# Patient Record
Sex: Male | Born: 1985
Health system: Southern US, Community
[De-identification: ages and names within clinical notes are randomized; demographics above are authoritative.]

## PROBLEM LIST (undated history)

## (undated) DIAGNOSIS — Z973 Presence of spectacles and contact lenses: Secondary | ICD-10-CM

## (undated) DIAGNOSIS — K219 Gastro-esophageal reflux disease without esophagitis: Secondary | ICD-10-CM

## (undated) HISTORY — DX: Gastro-esophageal reflux disease without esophagitis: K21.9

## (undated) HISTORY — DX: Presence of spectacles and contact lenses: Z97.3

---

## 2007-09-22 HISTORY — PX: WISDOM TOOTH EXTRACTION: SHX21

## 2012-12-15 ENCOUNTER — Ambulatory Visit (INDEPENDENT_AMBULATORY_CARE_PROVIDER_SITE_OTHER): Payer: BC Managed Care – PPO | Admitting: General Surgery

## 2012-12-15 ENCOUNTER — Encounter (INDEPENDENT_AMBULATORY_CARE_PROVIDER_SITE_OTHER): Payer: Self-pay | Admitting: General Surgery

## 2012-12-15 ENCOUNTER — Encounter (INDEPENDENT_AMBULATORY_CARE_PROVIDER_SITE_OTHER): Payer: Self-pay

## 2012-12-15 VITALS — BP 132/70 | HR 84 | Temp 97.4°F | Resp 16 | Ht 68.0 in | Wt 195.0 lb

## 2012-12-15 DIAGNOSIS — IMO0002 Reserved for concepts with insufficient information to code with codable children: Secondary | ICD-10-CM

## 2012-12-15 DIAGNOSIS — S76219A Strain of adductor muscle, fascia and tendon of unspecified thigh, initial encounter: Secondary | ICD-10-CM | POA: Insufficient documentation

## 2012-12-15 NOTE — Patient Instructions (Signed)
Light activities for 4-6 weeks from the time of the injury.

## 2012-12-15 NOTE — Progress Notes (Signed)
Patient ID: Micheal Moreno, male   DOB: 1985-12-06, 27 y.o.   MRN: [id number]  Chief Complaint  Patient presents with  . Inguinal Hernia    bilateral    HPI Micheal Moreno is a 27 y.o. male.   HPI  He is referred by Micheal Guard, PA-C for evaluation of LLQ and groin pain and possible inguinal hernia.  He was working out at [employer] fairly hard and noted some severe pain in the left lower quadrant and groin region. He says he felt he saw little swelling. This happened 2 weeks ago. He sought attention at that time. He has decreased his activities and is now feeling better. No difficulty with urination. No difficulty with bowel movements. No family history of hernia disease.  Past Medical History  Diagnosis Date  . GERD (gastroesophageal reflux disease)     Past Surgical History  Procedure Laterality Date  . Tonsillectomy    . Wisdom tooth extraction  2009    History reviewed. No pertinent family history.  Social History History  Substance Use Topics  . Smoking status: Never Smoker   . Smokeless tobacco: Not on file  . Alcohol Use: No    No Known Allergies  Current Outpatient Prescriptions  Medication Sig Dispense Refill  . esomeprazole (NEXIUM) 40 MG capsule Take 40 mg by mouth daily before breakfast.       No current facility-administered medications for this visit.    Review of Systems Review of Systems  Constitutional: Negative.   Respiratory: Negative.   Cardiovascular: Negative.   Genitourinary: Negative.   Allergic/Immunologic: Negative for immunocompromised state.  Hematological: Negative.     Blood pressure 132/70, pulse 84, temperature 97.4 F (36.3 C), temperature source Temporal, resp. rate 16, height 5\' 8"  (1.727 m), weight 195 lb (88.451 kg).  Physical Exam Physical Exam  Constitutional: He appears well-developed and well-nourished. No distress.  HENT:  Head: Normocephalic and atraumatic.  Abdominal: Soft. He exhibits no distension and no  mass. There is no tenderness.  Genitourinary:  No inguinal floor defects bilaterally. Testicles are normal in size.    Data Reviewed Note from [facility]  Assessment    Left groin strain. Provocative maneuvers today do not demonstrate any evidence of left or right inguinal hernia. Symptoms are improving with rest.     Plan    Avoid strenuous core type activities for 4-6 weeks from date of injury. Return visit as needed.        Micheal Moreno 12/15/2012, 3:34 PM

## 2013-03-22 ENCOUNTER — Encounter: Payer: Self-pay | Admitting: Medical

## 2013-03-29 ENCOUNTER — Encounter: Payer: Self-pay | Admitting: Medical

## 2013-03-29 ENCOUNTER — Ambulatory Visit (INDEPENDENT_AMBULATORY_CARE_PROVIDER_SITE_OTHER): Payer: BC Managed Care – PPO | Admitting: Medical

## 2013-03-29 VITALS — BP 110/70 | HR 80 | Temp 98.0°F | Resp 16 | Ht 68.0 in | Wt 196.0 lb

## 2013-03-29 DIAGNOSIS — Z0000 Encounter for general adult medical examination without abnormal findings: Secondary | ICD-10-CM

## 2013-03-29 DIAGNOSIS — H68013 Acute Eustachian salpingitis, bilateral: Secondary | ICD-10-CM

## 2013-03-29 DIAGNOSIS — H68019 Acute Eustachian salpingitis, unspecified ear: Secondary | ICD-10-CM

## 2013-03-29 LAB — POCT URINALYSIS DIPSTICK
Bilirubin, UA: NEGATIVE
Blood, UA: NEGATIVE
Ketones, UA: NEGATIVE
Nitrite, UA: NEGATIVE
Spec Grav, UA: <=1.005
pH, UA: 5

## 2013-03-29 NOTE — Patient Instructions (Signed)
Begin OTC Sudafed x 5-7 days, increase water intake, and if this doesn't seem to resolve the problem, let me know.   Barotitis Media Barotitis media is soreness (inflammation) of the area behind the eardrum (middle ear). This occurs when the auditory tube (Eustachian tube) leading from the back of the throat to the eardrum is blocked. When it is blocked air cannot move in and out of the middle ear to equalize pressure changes. These pressure changes come from changes in altitude when:  Flying.  Driving in the mountains.  Diving. Problems are more likely to occur with pressure changes during times when you are congested as from:  Hay fever.  Upper respiratory infection.  A cold. Damage or hearing loss (barotrauma) caused by this may be permanent. HOME CARE INSTRUCTIONS   Use medicines as recommended by your caregiver. Over the counter medicines will help unblock the canal and can help during times of air travel.  Do not put anything into your ears to clean or unplug them. Eardrops will not be helpful.  Do not swim, dive, or fly until your caregiver says it is all right to do so. If these activities are necessary, chewing gum with frequent swallowing may help. It is also helpful to hold your nose and gently blow to pop your ears for equalizing pressure changes. This forces air into the Eustachian tube.  For little ones with problems, give your baby a bottle of water or juice during periods when pressure changes would be anticipated such as during take offs and landings associated with air travel.  Only take over-the-counter or prescription medicines for pain, discomfort, or fever as directed by your caregiver.  A decongestant may be helpful in de-congesting the middle ear and make pressure equalization easier. This can be even more effective if the drops (spray) are delivered with the head lying over the edge of a bed with the head tilted toward the ear on the affected side.  If your  caregiver has given you a follow-up appointment, it is very important to keep that appointment. Not keeping the appointment could result in a chronic or permanent injury, pain, hearing loss and disability. If there is any problem keeping the appointment, you must call back to this facility for assistance. SEEK IMMEDIATE MEDICAL CARE IF:   You develop a severe headache, dizziness, severe ear pain, or bloody or pus-like drainage from your ears.  An oral temperature above 102 F (38.9 C) develops.  Your problems do not improve or become worse. MAKE SURE YOU:   Understand these instructions.  Will watch your condition.  Will get help right away if you are not doing well or get worse. Document Released: 09/04/2000 Document Revised: 11/30/2011 Document Reviewed: 04/12/2008 [facility] Patient Information 2014 [place], [place].    Preventative Care for Adults, Male       REGULAR HEALTH EXAMS:  A routine yearly physical is a good way to check in with your primary care provider about your health and preventive screening. It is also an opportunity to share updates about your health and any concerns you have, and receive a thorough all-over exam.   Most health insurance companies pay for at least some preventative services.  Check with your health plan for specific coverages.  WHAT PREVENTATIVE SERVICES DO MEN NEED?  Adult men should have their weight and blood pressure checked regularly.   Men age 70 and older should have their cholesterol levels checked regularly.  Beginning at age 90 and continuing to age  75, men should be screened for colorectal cancer.  Certain people should may need continued testing until age 45.  Other cancer screening may include exams for testicular and prostate cancer.  Updating vaccinations is part of preventative care.  Vaccinations help protect against diseases such as the flu.  Lab tests are generally done as part of preventative care to screen for anemia and  blood disorders, to screen for problems with the kidneys and liver, to screen for bladder problems, to check blood sugar, and to check your cholesterol level.  Preventative services generally include counseling about diet, exercise, avoiding tobacco, drugs, excessive alcohol consumption, and sexually transmitted infections.    GENERAL RECOMMENDATIONS FOR GOOD HEALTH:  Healthy diet:  Eat a variety of foods, including fruit, vegetables, animal or vegetable protein, such as meat, fish, chicken, and eggs, or beans, lentils, tofu, and grains, such as rice.  Drink plenty of water daily.  Decrease saturated fat in the diet, avoid lots of red meat, processed foods, sweets, fast foods, and fried foods.  Exercise:  Aerobic exercise helps maintain good heart health. At least 30-40 minutes of moderate-intensity exercise is recommended. For example, a brisk walk that increases your heart rate and breathing. This should be done on most days of the week.   Find a type of exercise or a variety of exercises that you enjoy so that it becomes a part of your daily life.  Examples are running, walking, swimming, water aerobics, and biking.  For motivation and support, explore group exercise such as aerobic class, spin class, Zumba, Yoga,or  martial arts, etc.    Set exercise goals for yourself, such as a certain weight goal, walk or run in a race such as a 5k walk/run.  Speak to your primary care provider about exercise goals.  Disease prevention:  If you smoke or chew tobacco, find out from your caregiver how to quit. It can literally save your life, no matter how long you have been a tobacco user. If you do not use tobacco, never begin.   Maintain a healthy diet and normal weight. Increased weight leads to problems with blood pressure and diabetes.   The Body Mass Index or BMI is a way of measuring how much of your body is fat. Having a BMI above 27 increases the risk of heart disease, diabetes,  hypertension, stroke and other problems related to obesity. Your caregiver can help determine your BMI and based on it develop an exercise and dietary program to help you achieve or maintain this important measurement at a healthful level.  High blood pressure causes heart and blood vessel problems.  Persistent high blood pressure should be treated with medicine if weight loss and exercise do not work.   Fat and cholesterol leaves deposits in your arteries that can block them. This causes heart disease and vessel disease elsewhere in your body.  If your cholesterol is found to be high, or if you have heart disease or certain other medical conditions, then you may need to have your cholesterol monitored frequently and be treated with medication.   Ask if you should have a stress test if your history suggests this. A stress test is a test done on a treadmill that looks for heart disease. This test can find disease prior to there being a problem.  Avoid drinking alcohol in excess (more than two drinks per day).  Avoid use of street drugs. Do not share needles with anyone. Ask for professional help if you need  assistance or instructions on stopping the use of alcohol, cigarettes, and/or drugs.  Brush your teeth twice a day with fluoride toothpaste, and floss once a day. Good oral hygiene prevents tooth decay and gum disease. The problems can be painful, unattractive, and can cause other health problems. Visit your dentist for a routine oral and dental check up and preventive care every 6-12 months.   Look at your skin regularly.  Use a mirror to look at your back. Notify your caregivers of changes in moles, especially if there are changes in shapes, colors, a size larger than a pencil eraser, an irregular border, or development of new moles.  Safety:  Use seatbelts 100% of the time, whether driving or as a passenger.  Use safety devices such as hearing protection if you work in environments with loud  noise or significant background noise.  Use safety glasses when doing any work that could send debris in to the eyes.  Use a helmet if you ride a bike or motorcycle.  Use appropriate safety gear for contact sports.  Talk to your caregiver about gun safety.  Use sunscreen with a SPF (or skin protection factor) of 15 or greater.  Lighter skinned people are at a greater risk of skin cancer. Don't forget to also wear sunglasses in order to protect your eyes from too much damaging sunlight. Damaging sunlight can accelerate cataract formation.   Practice safe sex. Use condoms. Condoms are used for birth control and to help reduce the spread of sexually transmitted infections (or STIs).  Some of the STIs are gonorrhea (the clap), chlamydia, syphilis, trichomonas, herpes, HPV (human papilloma virus) and HIV (human immunodeficiency virus) which causes AIDS. The herpes, HIV and HPV are viral illnesses that have no cure. These can result in disability, cancer and death.   Keep carbon monoxide and smoke detectors in your home functioning at all times. Change the batteries every 6 months or use a model that plugs into the wall.   Vaccinations:  Stay up to date with your tetanus shots and other required immunizations. You should have a booster for tetanus every 10 years. Be sure to get your flu shot every year, since 5%-20% of the [place] population comes down with the flu. The flu vaccine changes each year, so being vaccinated once is not enough. Get your shot in the fall, before the flu season peaks.   Other vaccines to consider:  Pneumococcal vaccine to protect against certain types of pneumonia.  This is normally recommended for adults age 58 or older.  However, adults younger than 25 years old with certain underlying conditions such as diabetes, heart or lung disease should also receive the vaccine.  Shingles vaccine to protect against Varicella Zoster if you are older than age 16, or younger than 25 years old  with certain underlying illness.  Hepatitis A vaccine to protect against a form of infection of the liver by a virus acquired from food.  Hepatitis B vaccine to protect against a form of infection of the liver by a virus acquired from blood or body fluids, particularly if you work in health care.  If you plan to travel internationally, check with your local health department for specific vaccination recommendations.  Cancer Screening:  Most routine colon cancer screening begins at the age of 51. On a yearly basis, doctors may provide special easy to use take-home tests to check for hidden blood in the stool. Sigmoidoscopy or colonoscopy can detect the earliest forms of colon cancer  and is life saving. These tests use a small camera at the end of a tube to directly examine the colon. Speak to your caregiver about this at age 10, when routine screening begins (and is repeated every 5 years unless early forms of pre-cancerous polyps or small growths are found).   At the age of 63 men usually start screening for prostate cancer every year. Screening may begin at a younger age for those with higher risk. Those at higher risk include African-Americans or having a family history of prostate cancer. There are two types of tests for prostate cancer:   Prostate-specific antigen (PSA) testing. Recent studies raise questions about prostate cancer using PSA and you should discuss this with your caregiver.   Digital rectal exam (in which your doctor's lubricated and gloved finger feels for enlargement of the prostate through the anus).   Screening for testicular cancer.  Do a monthly exam of your testicles. Gently roll each testicle between your thumb and fingers, feeling for any abnormal lumps. The best time to do this is after a hot shower or bath when the tissues are looser. Notify your caregivers of any lumps, tenderness or changes in size or shape immediately.

## 2013-03-29 NOTE — Progress Notes (Signed)
Subjective:   HPI  Micheal Moreno is a 27 y.o. male who presents for a complete physical.  New patient today.   Moved to [facility] 8mo for work.   Was living in [place], [place] prior.  Been in usual state of health.   Just got married 1 week ago.     Preventative care: Last ophthalmology visit:[facility] 11/2012- [facility] Last dental visit: prior dentist in [place] Last colonoscopy:N/A Last prostate exam: N/A Last EKG:N/A Last labs:N/A  Prior vaccinations: TD or Tdap:2007 Influenza:N/a Pneumococcal:n/a Shingles/Zostavax:n/a  Advanced directive:n/a Health care power of attorney:n/a Living will:n/a  Concerns: Been having some bilat ear pain, radiating down into neck.  Started 4wk ago.  [facility]ent to [facility], was given steroid pill pack, didn't help things.   Still bothering him.  No ringing.  No hearing changes.  No ear drainage.  Had cold prior to this starting.   No sore throat, no fever, no cough.  No prior similar.    Upon review of prior records, had 2011 mental health hospitalization visit over night. Was stress related, lots of stress with college work, social life.  Prior and since no issues with mood, depression, irritability, sleep issues, or other.  Feels happy, just got married, things are going well at home and with job.   Reviewed their medical, surgical, family, social, medication, and allergy history and updated chart as appropriate.   Past Medical History  Diagnosis Date  . GERD (gastroesophageal reflux disease)   . Wears contact lenses   . Anxiety 2011    acute stress, overnight stay hospitalization    Past Surgical History  Procedure Laterality Date  . Wisdom tooth extraction  2009    Family History  Problem Relation Age of Onset  . Diabetes Maternal Grandfather   . Heart disease Neg Hx   . Cancer Neg Hx   . Stroke Neg Hx   . Hypertension Neg Hx     History   Social History  . Marital Status: Single    Spouse Name: N/A    Number of Children:  N/A  . Years of Education: N/A   Occupational History  . Not on file.   Social History Main Topics  . Smoking status: Never Smoker   . Smokeless tobacco: Not on file  . Alcohol Use: No  . Drug Use: No  . Sexually Active: Not on file   Other Topics Concern  . 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  1 dog.   No children.       Current Outpatient Prescriptions on File Prior to Visit  Medication Sig Dispense Refill  . esomeprazole (NEXIUM) 40 MG capsule Take 40 mg by mouth daily before breakfast.       No current facility-administered medications on file prior to visit.    No Known Allergies   Review of Systems Constitutional: -fever, -chills, -sweats, -unexpected weight change, -decreased appetite, -fatigue Allergy: -sneezing, -itching, -congestion Dermatology: -changing moles, --rash, -lumps ENT: -runny nose, +ear pain, -sore throat, -hoarseness, -sinus pain, -teeth pain, - ringing in ears, -hearing loss, -nosebleeds Cardiology: -chest pain, -palpitations, -swelling, -difficulty breathing when lying flat, -waking up short of breath Respiratory: -cough, -shortness of breath, -difficulty breathing with exercise or exertion, -wheezing, -coughing up blood Gastroenterology: -abdominal pain, -nausea, -vomiting, -diarrhea, -constipation, -blood in stool, -changes in bowel movement, -difficulty swallowing or eating Hematology: -  bleeding, -bruising  Musculoskeletal: -joint aches, -muscle aches, -joint swelling, -back pain, +neck pain, -cramping, -changes in gait Ophthalmology: denies vision changes, eye redness, itching, discharge Urology: -burning with urination, -difficulty urinating, -blood in urine, -urinary frequency, -urgency, -incontinence Neurology: -headache, -weakness, -tingling, -numbness, -memory loss, -falls, -dizziness Psychology: -depressed mood,  -agitation, -sleep problems     Objective:   Physical Exam  Nurse notes and vitals reviewed  General appearance: alert, no distress, WD/WN, white male Skin: few scattered benign appearing papular lesions, 1 each left and right abdomen 3mm diameter, pink, 2 flat 3mm diameter macules right mid back, otherwise no worrisome lesions HEENT: normocephalic, conjunctiva/corneas normal, sclerae anicteric, PERRLA, EOMi, nares patent, no discharge or erythema, pharynx normal Oral cavity: MMM, tongue normal, teeth in good repair Neck: supple, no lymphadenopathy, no thyromegaly, no masses, normal ROM Chest: non tender, normal shape and expansion Heart: RRR, normal S1, S2, no murmurs Lungs: CTA bilaterally, no wheezes, rhonchi, or rales Abdomen: +bs, soft, non tender, non distended, no masses, no hepatomegaly, no splenomegaly, no bruits Back: non tender, normal ROM, no scoliosis Musculoskeletal: upper extremities non tender, no obvious deformity, normal ROM throughout, lower extremities non tender, no obvious deformity, normal ROM throughout Extremities: no edema, no cyanosis, no clubbing Pulses: 2+ symmetric, upper and lower extremities, normal cap refill Neurological: alert, oriented x 3, CN2-12 intact, strength normal upper extremities and lower extremities, sensation normal throughout, DTRs 2+ throughout, no cerebellar signs, gait normal Psychiatric: normal affect, behavior normal, pleasant  GU: normal male external genitalia, circumcised, nontender, no masses, no hernia, no lymphadenopathy Rectal: deferred   Assessment and Plan :    Encounter Diagnoses  Name Primary?  . Routine general medical examination at a health care facility Yes  . Eustachian salpingitis, acute, bilateral     Physical exam - discussed healthy lifestyle, diet, exercise, preventative care, vaccinations, and addressed their concerns.  Labs done 1.5 years ago for baseline, cholesterol, etc.   Eustachian tube salpingitis  - Begin OTC Sudafed, increase water intake, and if not improving, call/return.   Follow-up pending prior records review

## 2013-05-24 ENCOUNTER — Ambulatory Visit (INDEPENDENT_AMBULATORY_CARE_PROVIDER_SITE_OTHER): Payer: BC Managed Care – PPO | Admitting: Medical

## 2013-05-24 ENCOUNTER — Encounter: Payer: Self-pay | Admitting: Medical

## 2013-05-24 VITALS — BP 100/78 | HR 78 | Temp 98.3°F | Resp 16 | Wt 195.0 lb

## 2013-05-24 DIAGNOSIS — H9203 Otalgia, bilateral: Secondary | ICD-10-CM

## 2013-05-24 DIAGNOSIS — M542 Cervicalgia: Secondary | ICD-10-CM

## 2013-05-24 DIAGNOSIS — H9209 Otalgia, unspecified ear: Secondary | ICD-10-CM

## 2013-05-24 MED ORDER — AMOXICILLIN 875 MG PO TABS: 875.0000 mg | ORAL_TABLET | Freq: Two times a day (BID) | ORAL | Status: DC

## 2013-05-24 MED ORDER — MOMETASONE FUROATE 50 MCG/ACT NA SUSP: 2.0000 | Freq: Every day | NASAL | Status: DC

## 2013-05-24 NOTE — Progress Notes (Signed)
Subjective:   Micheal Moreno is a 27 y.o. male who presents with ear/face pain, neck pain, possible ear infection.  I saw him for similar a few months ago and symptoms are unchanged despite using frequent Sudafed. Symptoms include: pain in neck and ear, but nonspecific in band like pattern mostly in anterior neck bilat with radiation to ears.. Onset of symptoms was months ago, and have been unchanged since that time. Associated symptoms include: none.  Patient denies: achiness, chills, congestion, low grade fever, non productive cough, post nasal drip, productive cough, sinus pressure, sneezing and sore throat.  No recent injury, trauma, or head injury.  He is drinking plenty of fluids.  Treatment to date: sudafed.  Denies sick contacts.  Occasionally he gets headache, but no other symptoms, no decreased neck ROM, no neck or shoulder pain otherwise, no rash, no TMJ problems prior.  Saw dentist 17mo ago and everything was fine.  No hx/o frequent ear or sinus issues.  No other aggravating or relieving factors.  No worrisome cancer history in family.  No other c/o.  ROS as in subjective  Past Medical History  Diagnosis Date  . GERD (gastroesophageal reflux disease)   . Wears contact lenses   . Anxiety 2011    acute stress, overnight stay hospitalization   Family History  Problem Relation Age of Onset  . Diabetes Maternal Grandfather   . Heart disease Neg Hx   . Cancer Neg Hx   . Stroke Neg Hx   . Hypertension Neg Hx     Objective:  Filed Vitals:   05/24/13 0904  BP: 100/78  Pulse: 78  Temp: 98.3 F (36.8 C)  Resp: 16    General appearance: Alert, WD/WN, no distress                             Skin: warm, no rash                           Head: atraumatic, normocephalic, no sinus tenderness, no head tenderness, no deformity                            Eyes: conjunctiva normal, corneas clear, PERRLA, EOMi                            Ears:TMs pearly, external ear canals normal                Nose: septum midline, turbinates normal, no erythema and no discharge             Mouth/throat: MMM, tongue normal, no pharyngeal erythema, no obvious tooth decay                           Neck: supple, nontender, normal ROM, no adenopathy, no thyromegaly, nontender                          Lungs: CTA bilaterally, no wheezes, rales, or rhonchi     Assessment: Encounter Diagnoses  Name Primary?  . Ear pain, bilateral Yes  . Neck pain     Plan: Etiology unclear, symptoms vague, and normal exam, no precipitating event.  Possible eustachian tube dysfunction, but not clear.   No obvious mass, lymphadenitis,  neck injury, TMJ, sinusitis or other.   No other concerning symptoms.   He has hx/o GERD, but GERD doesn't seem to be a factor at this time.  We will use trial of Amoxicillin and Nasonex for possible eustachian tube problem, but if this doesn't resolve in a week, consider CT head and neck, CBC and thyroid labs.

## 2013-09-21 HISTORY — PX: ANKLE SURGERY: SHX546

## 2014-05-21 ENCOUNTER — Ambulatory Visit: Payer: BC Managed Care – PPO | Admitting: Medical

## 2014-06-02 ENCOUNTER — Emergency Department (HOSPITAL_COMMUNITY)
Admission: EM | Admit: 2014-06-02 | Discharge: 2014-06-02 | Disposition: A | Discharge status: Home / Self Care | Payer: BC Managed Care – PPO | Attending: Emergency Medicine | Admitting: Emergency Medicine

## 2014-06-02 ENCOUNTER — Encounter (HOSPITAL_COMMUNITY): Payer: Self-pay | Admitting: Emergency Medicine

## 2014-06-02 ENCOUNTER — Emergency Department (HOSPITAL_COMMUNITY): Payer: BC Managed Care – PPO

## 2014-06-02 DIAGNOSIS — Z8659 Personal history of other mental and behavioral disorders: Secondary | ICD-10-CM | POA: Diagnosis not present

## 2014-06-02 DIAGNOSIS — S8990XA Unspecified injury of unspecified lower leg, initial encounter: Secondary | ICD-10-CM | POA: Diagnosis present

## 2014-06-02 DIAGNOSIS — Z792 Long term (current) use of antibiotics: Secondary | ICD-10-CM | POA: Diagnosis not present

## 2014-06-02 DIAGNOSIS — Y9239 Other specified sports and athletic area as the place of occurrence of the external cause: Secondary | ICD-10-CM | POA: Diagnosis not present

## 2014-06-02 DIAGNOSIS — Y9366 Activity, soccer: Secondary | ICD-10-CM | POA: Diagnosis not present

## 2014-06-02 DIAGNOSIS — K219 Gastro-esophageal reflux disease without esophagitis: Secondary | ICD-10-CM | POA: Insufficient documentation

## 2014-06-02 DIAGNOSIS — Z79899 Other long term (current) drug therapy: Secondary | ICD-10-CM | POA: Insufficient documentation

## 2014-06-02 DIAGNOSIS — S82401A Unspecified fracture of shaft of right fibula, initial encounter for closed fracture: Secondary | ICD-10-CM

## 2014-06-02 DIAGNOSIS — Y92838 Other recreation area as the place of occurrence of the external cause: Secondary | ICD-10-CM

## 2014-06-02 DIAGNOSIS — S99929A Unspecified injury of unspecified foot, initial encounter: Secondary | ICD-10-CM

## 2014-06-02 DIAGNOSIS — IMO0002 Reserved for concepts with insufficient information to code with codable children: Secondary | ICD-10-CM | POA: Diagnosis not present

## 2014-06-02 DIAGNOSIS — S82899A Other fracture of unspecified lower leg, initial encounter for closed fracture: Secondary | ICD-10-CM | POA: Insufficient documentation

## 2014-06-02 DIAGNOSIS — S99919A Unspecified injury of unspecified ankle, initial encounter: Secondary | ICD-10-CM | POA: Diagnosis present

## 2014-06-02 DIAGNOSIS — W010XXA Fall on same level from slipping, tripping and stumbling without subsequent striking against object, initial encounter: Secondary | ICD-10-CM | POA: Insufficient documentation

## 2014-06-02 MED ORDER — OXYCODONE-ACETAMINOPHEN 5-325 MG PO TABS
1.0000 | ORAL_TABLET | Freq: Once | ORAL | Status: AC
  Administered 2014-06-02: 1 via ORAL
  Filled 2014-06-02: qty 1

## 2014-06-02 MED ORDER — OXYCODONE-ACETAMINOPHEN 5-325 MG PO TABS: 1.0000 | ORAL_TABLET | Freq: Four times a day (QID) | ORAL | Status: DC | PRN

## 2014-06-02 NOTE — ED Notes (Signed)
Pt reports playing soccer and falling after sliding into the ball. Pt heard his right ankle pop. There is moderate swelling around the right ankle. Pt is A/O x4, in NAD, and vitals are WDL.

## 2014-06-02 NOTE — ED Provider Notes (Signed)
CSN: [id number]     Arrival date & time 06/02/14  1731 History   First MD Initiated Contact with Patient 06/02/14 1851     Chief Complaint  Patient presents with  . Ankle Pain     (Consider location/radiation/quality/duration/timing/severity/associated sxs/prior Treatment) HPI   28 year old male presents for evaluation of right ankle injury. Patient states he was playing soccer approximately 2 hours ago when he slipped his right ankle on the soccer ball and landed awkwardly on his R ankle.  Report hearing a "pop" follows with acute onset of sharp pain to R ankle.  Pt unable to bear weight afterward.  Pain is intense, non radiating, worsening with movement. No complaints of pain to his right knee or right hip. Denies hitting his head or loss of consciousness. Denies any precipitating symptoms prior to fall. He is bringing his right ankle in the past but never fractured it. His pain has improved after receiving pain medication in ED. No complaints of numbness.  Past Medical History  Diagnosis Date  . GERD (gastroesophageal reflux disease)   . Wears contact lenses   . Anxiety 2011    acute stress, overnight stay hospitalization   Past Surgical History  Procedure Laterality Date  . Wisdom tooth extraction  2009   Family History  Problem Relation Age of Onset  . Diabetes Maternal Grandfather   . Heart disease Neg Hx   . Cancer Neg Hx   . Stroke Neg Hx   . Hypertension Neg Hx    History  Substance Use Topics  . Smoking status: Never Smoker   . Smokeless tobacco: Not on file  . Alcohol Use: No    Review of Systems  Constitutional: Negative for fever.  Musculoskeletal: Positive for arthralgias.  Skin: Positive for wound.  Neurological: Negative for numbness.      Allergies  Review of patient's allergies indicates no known allergies.  Home Medications   Prior to Admission medications   Medication Sig Start Date End Date Taking? Authorizing Provider  amoxicillin (AMOXIL)  875 MG tablet Take 1 tablet (875 mg total) by mouth 2 (two) times daily. 05/24/13   Kermit Balo Tysinger, PA-C  esomeprazole (NEXIUM) 40 MG capsule Take 40 mg by mouth daily before breakfast.    Historical Provider, MD  mometasone (NASONEX) 50 MCG/ACT nasal spray Place 2 sprays into the nose daily. 05/24/13   Kermit Balo Tysinger, PA-C   BP 139/78  Pulse 92  Temp(Src) 98.6 F (37 C) (Oral)  Resp 17  SpO2 100% Physical Exam  Constitutional: He appears well-developed and well-nourished. No distress.  HENT:  Head: Atraumatic.  Eyes: Conjunctivae are normal.  Neck: Normal range of motion. Neck supple.  Cardiovascular: Intact distal pulses.   Musculoskeletal: He exhibits tenderness (R ankle:  right ankle is markedly edematous with diffuse tenderness most significant to both medial and lateral malleolus on palpation and minimal tenderness to posterior malleolus region. Decrease range of motion secondary to pain. No crepitus. ).  Right knee and right hip nontender on palpation.  Neurological: He is alert.  Skin: No rash noted.  Small abrasion noted to the dorsums of right ankle without laceration.  Psychiatric: He has a normal mood and affect.    ED Course  Procedures (including critical care time)  7:14 PM Patient with a mechanical injury to his right ankle. X-rays demonstrate a distal fibular fracture with suspected medial collateral ligament injury. This is a closed injury. He does have small abrasion noted to the dorsum of  his right ankle. He is up-to-date with his tetanus. His pain is currently controlled. We'll place patient's ankle in a posterior and stirrup splint with crutches and nonweightbearing. Rice therapy discussed. Patient will follow up closely with orthopedic for further management.  Care discussed with Dr. Radford Pax.   Labs Review Labs Reviewed - No data to display  Imaging Review Dg Ankle Complete Right  06/02/2014   CLINICAL DATA:  Soccer injury.  EXAM: RIGHT ANKLE - COMPLETE 3+  VIEW  COMPARISON:  None.  FINDINGS: There is an acute fracture involving the distal fibula. There is lateral displacement of the tibiotalar joint which suggest medial collateral ligament injury. No radiopaque foreign bodies.  IMPRESSION: 1. Distal fibular fracture. 2. Suspected medial collateral ligament injury.   Electronically Signed   By: Signa Kell M.D.   On: 06/02/2014 18:31     EKG Interpretation None      MDM   Final diagnoses:  Right fibular fracture, closed, initial encounter    BP 132/62  Pulse 68  Temp(Src) 98.4 F (36.9 C) (Oral)  Resp 20  SpO2 99%  I have reviewed nursing notes and vital signs. I personally reviewed the imaging tests through PACS system  I reviewed available ER/hospitalization records thought the EMR     Fayrene Helper, [place] 06/02/14 1954

## 2014-06-02 NOTE — Discharge Instructions (Signed)
You have been evaluated for your ankle injury. You have been diagnosed with a fibular fracture.  Please wear splint, do not bear weight and use crutches to move around.  Follow instruction below.  Follow up closely with orthopedic doctor next week for further care.  Return if you have any concerns.    Fibular Fracture, Ankle, Adult, Undisplaced, Treated With Immobilization A simple fracture of the bone below the knee on the outside of your leg (fibula) usually heals without problems. CAUSES Typically, a fibular fracture occurs as a result of trauma. A blow to the side of your leg or a powerful twisting movement can cause a fracture. Fibular fractures are often seen as a result of football, soccer, or skiing injuries. SYMPTOMS Symptoms of a fibular fracture can include:  Pain.  Shortening or abnormal alignment of your lower leg (angulation). DIAGNOSIS A health care provider will need to examine the leg. X-ray exams will be ordered for further to confirm the fracture and evaluate the extent and of the injury. TREATMENT  Typically, a cast or immobilizer is applied. Sometimes a splint is placed on these fractures if it is needed for comfort or if the bones are badly out of place. Crutches may be needed to help you get around.  HOME CARE INSTRUCTIONS   Apply ice to the injured area:  Put ice in a plastic bag.  Place a towel between your skin and the bag.  Leave the ice on for 20 minutes, 2-3 times a day.  Use crutches as directed. Resume walking without crutches as directed by your health care provider or when comfortable doing so.  Only take over-the-counter or prescription medicines for pain, discomfort, or fever as directed by your health care provider.  Keeping your leg raised may lessen swelling.  If you have a removable splint or boot, do not remove the boot unless directed by your health care provider.  Do not not drive a car or operate a motor vehicle until your health care  provider specifically tells you it is safe to do so. SEEK IMMEDIATE MEDICAL CARE IF:   Your cast gets damaged or breaks.  You have continued severe pain or more swelling than you did before the cast was put on, or the pain is not controlled with medications.  Your skin or nails below the injury turn blue or grey, or feel cold or numb.  There is a bad smell or pus coming from under the cast.  You develop severe pain in ankle or foot. MAKE SURE YOU:   Understand these instructions.  Will watch your condition.  Will get help right away if you are not doing well or get worse. Document Released: 05/30/2002 Document Revised: 06/28/2013 Document Reviewed: 04/19/2013 [facility] Patient Information 2015 [place], [place]. This information is not intended to replace advice given to you by your health care provider. Make sure you discuss any questions you have with your health care provider.

## 2014-06-03 NOTE — ED Provider Notes (Signed)
Medical screening examination/treatment/procedure(s) were performed by non-physician practitioner and as supervising physician I was immediately available for consultation/collaboration.   Storm Sovine L Maryrose Colvin, MD 06/03/14 1016 

## 2015-02-13 ENCOUNTER — Ambulatory Visit: Payer: Self-pay | Admitting: Medical

## 2015-02-14 ENCOUNTER — Ambulatory Visit: Payer: Self-pay | Admitting: Medical

## 2015-06-02 IMAGING — CR DG ANKLE COMPLETE 3+V*R*
3 series · 3 of 3 positions shown · non-contrast
Comparison: None.

CLINICAL DATA: Soccer injury.

EXAM:
RIGHT ANKLE - COMPLETE 3+ VIEW

[x ankle ap right]
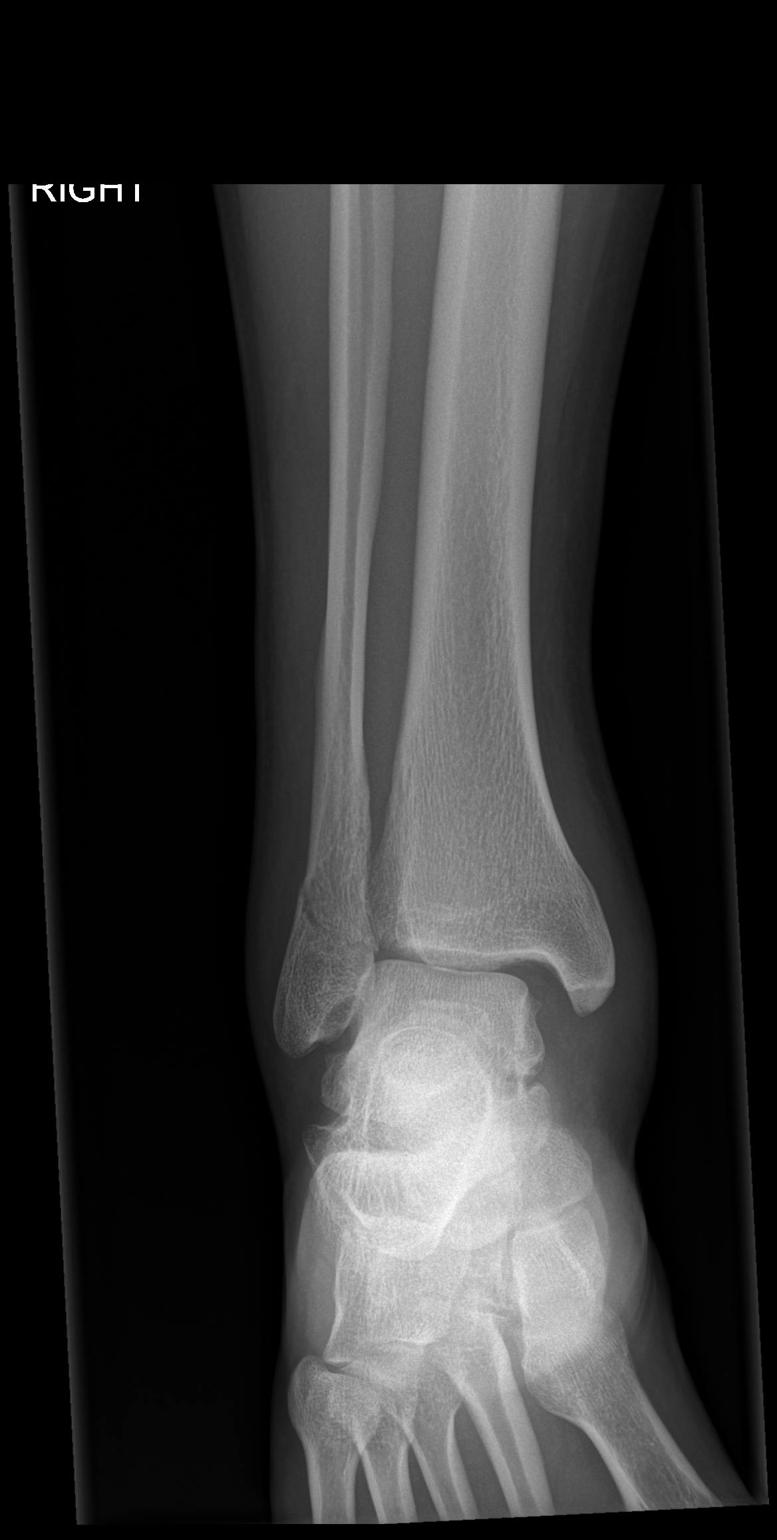

[x ankle obl right]
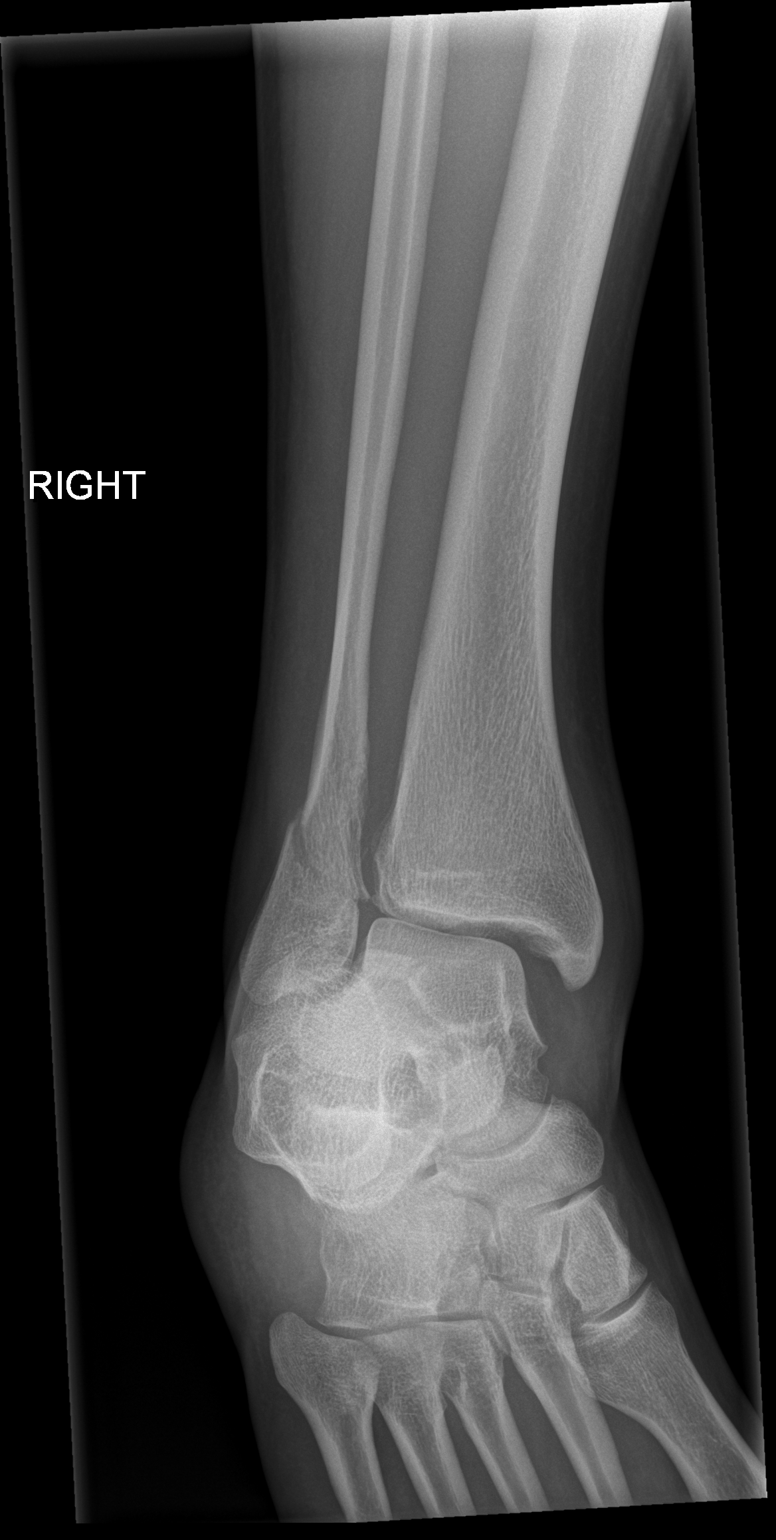

[x ankle lat right]
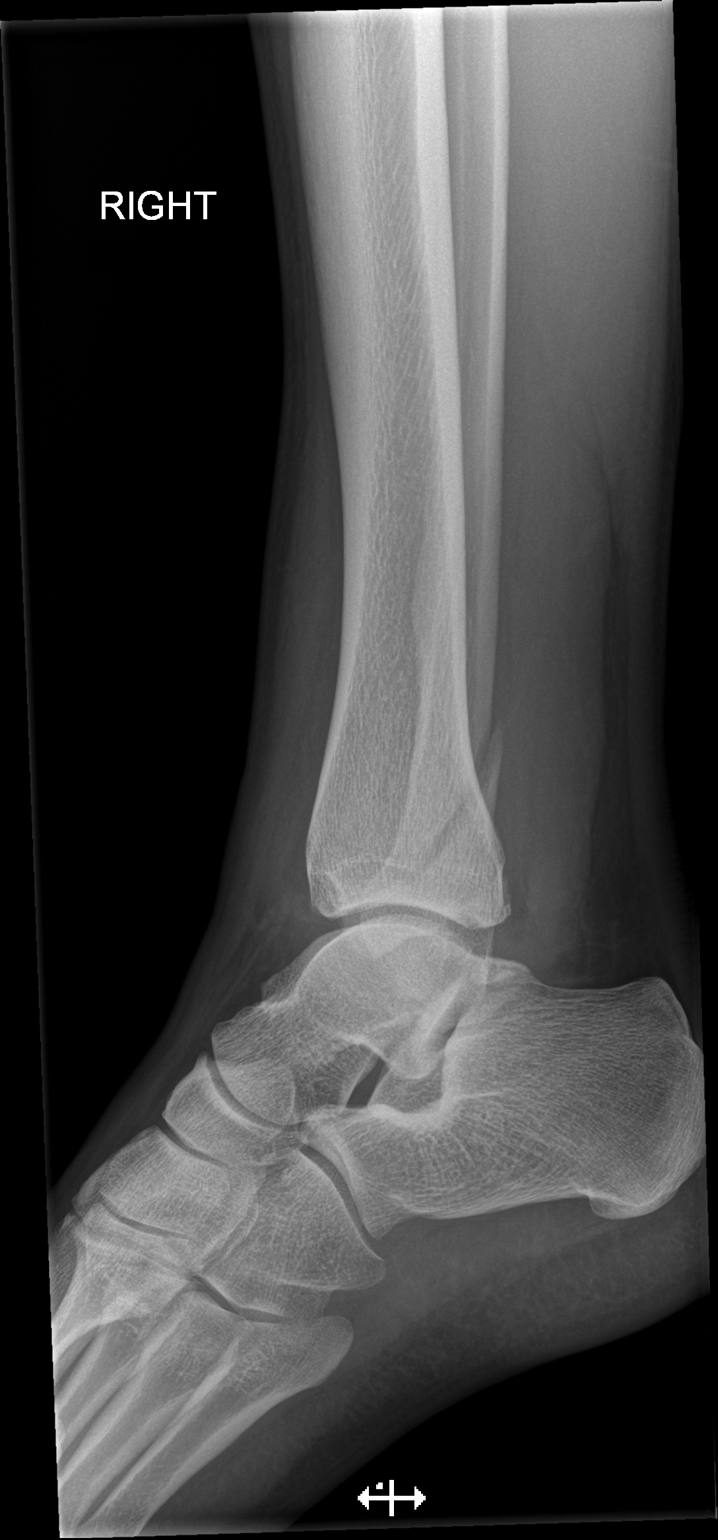

[3 of 3 positions shown; findings below may reference images not displayed]

FINDINGS: There is an acute fracture involving the distal fibula. There is
lateral displacement of the tibiotalar joint which suggest medial
collateral ligament injury. No radiopaque foreign bodies.
IMPRESSION: 1. Distal fibular fracture.
2. Suspected medial collateral ligament injury.

## 2016-04-21 ENCOUNTER — Encounter: Payer: Self-pay | Admitting: Medical

## 2016-04-21 ENCOUNTER — Telehealth: Payer: Self-pay

## 2016-04-21 DIAGNOSIS — Z0000 Encounter for general adult medical examination without abnormal findings: Secondary | ICD-10-CM

## 2016-04-21 NOTE — Telephone Encounter (Signed)
This patient no showed for their appointment today.Which of the following is necessary for this patient.   A) No follow-up necessary   B) Follow-up urgent. Locate Patient Immediately.   C) Follow-up necessary. Contact patient and Schedule visit in ____ Days.   D) Follow-up Advised. Contact patient and Schedule visit in ____ Days.  Has canceled last three appointments and has not been seen or had a physical since 2014

## 2016-04-21 NOTE — Telephone Encounter (Signed)
Pt is aware of fee and rescheduled and I informed him of his past canceled appts and that he needs to make sure he arrives for his next appt

## 2016-04-21 NOTE — Telephone Encounter (Signed)
D, no show fee

## 2016-04-24 ENCOUNTER — Encounter: Payer: Self-pay | Admitting: Medical

## 2016-04-24 NOTE — Telephone Encounter (Signed)
Back to mel for no show invoice. Pt has already rescheduled for Sept.

## 2016-05-26 ENCOUNTER — Encounter: Payer: Self-pay | Admitting: Medical

## 2016-05-26 ENCOUNTER — Ambulatory Visit (INDEPENDENT_AMBULATORY_CARE_PROVIDER_SITE_OTHER): Payer: 59 | Admitting: Medical

## 2016-05-26 VITALS — BP 112/70 | HR 85 | Resp 16 | Ht 67.0 in | Wt 183.0 lb

## 2016-05-26 DIAGNOSIS — Z0000 Encounter for general adult medical examination without abnormal findings: Secondary | ICD-10-CM | POA: Insufficient documentation

## 2016-05-26 DIAGNOSIS — Z8781 Personal history of (healed) traumatic fracture: Secondary | ICD-10-CM | POA: Diagnosis not present

## 2016-05-26 DIAGNOSIS — M7652 Patellar tendinitis, left knee: Secondary | ICD-10-CM | POA: Diagnosis not present

## 2016-05-26 DIAGNOSIS — H61893 Other specified disorders of external ear, bilateral: Secondary | ICD-10-CM | POA: Diagnosis not present

## 2016-05-26 DIAGNOSIS — Z23 Encounter for immunization: Secondary | ICD-10-CM

## 2016-05-26 DIAGNOSIS — H9193 Unspecified hearing loss, bilateral: Secondary | ICD-10-CM | POA: Diagnosis not present

## 2016-05-26 DIAGNOSIS — M25511 Pain in right shoulder: Secondary | ICD-10-CM | POA: Diagnosis not present

## 2016-05-26 DIAGNOSIS — M7650 Patellar tendinitis, unspecified knee: Secondary | ICD-10-CM | POA: Insufficient documentation

## 2016-05-26 DIAGNOSIS — M67431 Ganglion, right wrist: Secondary | ICD-10-CM | POA: Diagnosis not present

## 2016-05-26 DIAGNOSIS — H9190 Unspecified hearing loss, unspecified ear: Secondary | ICD-10-CM | POA: Insufficient documentation

## 2016-05-26 DIAGNOSIS — H61899 Other specified disorders of external ear, unspecified ear: Secondary | ICD-10-CM | POA: Insufficient documentation

## 2016-05-26 DIAGNOSIS — M67439 Ganglion, unspecified wrist: Secondary | ICD-10-CM | POA: Insufficient documentation

## 2016-05-26 LAB — CBC
HCT: 47.7 % (ref 38.5–50.0)
Hemoglobin: 16.3 g/dL (ref 13.2–17.1)
MCH: 29.9 pg (ref 27.0–33.0)
MCHC: 34.2 g/dL (ref 32.0–36.0)
MCV: 87.4 fL (ref 80.0–100.0)
MPV: 9.9 fL (ref 7.5–12.5)
Platelets: 263 10*3/uL (ref 140–400)
RBC: 5.46 MIL/uL (ref 4.20–5.80)
RDW: 13.2 % (ref 11.0–15.0)
WBC: 5.5 10*3/uL (ref 4.0–10.5)

## 2016-05-26 NOTE — Progress Notes (Signed)
Subjective:   HPI  Micheal Moreno is a 30 y.o. male who presents for a complete physical. Been in usual state of health.   Just had baby girl, first child, 51 weeks old.  Not getting a lot of sleep.   Concerns: Wife wants his hearing checked.   He wonders about wax  He does get some left knee pains, feels like something moves in the knee.  Does have hx/o MCL strain in the past during college years.  Uses chopath when running, and this helps.  He gets pains in right shoulder.  Plays tennis, [place] him fast, may have had an injury at that time.   Been having some pain since.     Reviewed their medical, surgical, family, social, medication, and allergy history and updated chart as appropriate.   Past Medical History:  Diagnosis Date  . GERD (gastroesophageal reflux disease)   . Wears contact lenses     Past Surgical History:  Procedure Laterality Date  . ANKLE SURGERY  2015   fracture, ORIF, s/p soccer injury  . WISDOM TOOTH EXTRACTION  2009    Family History  Problem Relation Age of Onset  . Diabetes Maternal Grandfather   . Dementia Maternal Grandfather   . Heart disease Neg Hx   . Cancer Neg Hx   . Stroke Neg Hx   . Hypertension Neg Hx     Social History   Social History  . Marital status: Single    Spouse name: N/A  . Number of children: N/A  . Years of education: N/A   Occupational History  . Not on file.   Social History Main Topics  . Smoking status: Never Smoker  . Smokeless tobacco: Never Used  . Alcohol use No  . Drug use: No  . Sexual activity: Not on file   Other Topics Concern  . 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  1 dog.  Has 6 week old girl.   As of 05/2016.      Current Outpatient Prescriptions on File Prior to Visit  Medication Sig Dispense Refill  . Multiple Vitamin  (MULTIVITAMIN WITH MINERALS) TABS tablet Take 1 tablet by mouth daily.     No current facility-administered medications on file prior to visit.     No Known Allergies   Review of Systems Constitutional: -fever, -chills, -sweats, -unexpected weight change, -decreased appetite, -fatigue Allergy: -sneezing, -itching, -congestion Dermatology: -changing moles, --rash, -lumps ENT: -runny nose, -ear pain, -sore throat, -hoarseness, -sinus pain, -teeth pain, - ringing in ears, -hearing loss, -nosebleeds Cardiology: -chest pain, -palpitations, -swelling, -difficulty breathing when lying flat, -waking up short of breath Respiratory: -cough, -shortness of breath, -difficulty breathing with exercise or exertion, -wheezing, -coughing up blood Gastroenterology: -abdominal pain, -nausea, -vomiting, -diarrhea, -constipation, -blood in stool, -changes in bowel movement, -difficulty swallowing or eating Hematology: -bleeding, -bruising  Musculoskeletal: +joint aches, -muscle aches, -joint swelling, -back pain, +neck pain, -cramping, -changes in gait Ophthalmology: denies vision changes, eye redness, itching, discharge Urology: -burning with urination, -difficulty urinating, -blood in urine, -urinary frequency, -urgency, -incontinence Neurology: -headache, -weakness, -tingling, -numbness, -memory loss, -falls, -dizziness Psychology: -depressed mood, -agitation, -sleep problems     Objective:   Physical Exam BP 112/70   Pulse 85   Resp 16   Ht 5\' 7"  (1.702 m)  Wt 183 lb (83 kg)   SpO2 98%   BMI 28.66 kg/m   General appearance: alert, no distress, WD/WN, white male Skin: few scattered benign appearing papular lesions, 1 each left and right abdomen 3mm diameter, pink, 2 flat 3mm diameter macules right mid back, otherwise no worrisome lesions HEENT: normocephalic, conjunctiva/corneas normal, sclerae anicteric, PERRLA, EOMi, nares patent, symmetrical 2mm raised pink polyp appearing lesions of bilat ear  canals, anterior portion of canal, no discharge or erythema, pharynx normal Oral cavity: MMM, tongue normal, teeth in good repair Neck: supple, no lymphadenopathy, no thyromegaly, no masses, normal ROM Chest: non tender, normal shape and expansion Heart: RRR, normal S1, S2, no murmurs Lungs: CTA bilaterally, no wheezes, rhonchi, or rales Abdomen: +bs, soft, non tender, non distended, no masses, no hepatomegaly, no splenomegaly, no bruits Back: non tender, normal ROM, no scoliosis Musculoskeletal: mild tenderness over right anterior deltoid, tender over left patellar tendon, right volar wrist over distal lateral radius with dense small 0.5cm somewhat round nodule most likely ganglion cyst, lateral vertical surgical scar over right ankle, otherwise upper extremities non tender, no obvious deformity, normal ROM throughout, lower extremities non tender, no obvious deformity, normal ROM throughout Extremities: no edema, no cyanosis, no clubbing Pulses: 2+ symmetric, upper and lower extremities, normal cap refill Neurological: alert, oriented x 3, CN2-12 intact, strength normal upper extremities and lower extremities, sensation normal throughout, DTRs 2+ throughout, no cerebellar signs, gait normal Psychiatric: normal affect, behavior normal, pleasant  GU: normal male external genitalia, circumcised, nontender, no masses, no hernia, no lymphadenopathy Rectal: deferred   Assessment and Plan :    Encounter Diagnoses  Name Primary?  . Annual physical exam Yes  . Need for prophylactic vaccination and inoculation against influenza   . Hearing decreased, bilateral   . Polyp of ear canal, bilateral   . Tendonitis, patellar, left   . Right shoulder pain   . Ganglion cyst of wrist, right   . History of ankle fracture     Physical exam - discussed healthy lifestyle, diet, exercise, preventative care, vaccinations, and addressed their concerns.   Routine labs today Counseled on the influenza virus  vaccine.  Vaccine information sheet given.  Influenza vaccine given after consent obtained. Hearing screen normal Ear canal lesion,likely ear canal cysts.  Consider ENT consult to rule out other Tendonitis - offered PT, xray.  He declines.   If this worsens, he will recheck or call.    Right shoulder pain - discussed symptoms, concerns.  Offered PT but he declines.  He will c/t to do stretching he is doing for now. Ganglion cyst - discussed diagnosis.   He will call/recheck if this becomes a problem Congratulated him on his new baby. Follow-up pending labs   Micheal RuizJohn was seen today for annual exam.  Diagnoses and all orders for this visit:  Annual physical exam -     Urinalysis Dipstick -     CBC -     Comprehensive metabolic panel -     Lipid panel -     VITAMIN D 25 Hydroxy (Vit-D Deficiency, Fractures)  Need for prophylactic vaccination and inoculation against influenza -     Flu Vaccine QUAD 36+ mos PF IM (Fluarix & Fluzone Quad PF)  Hearing decreased, bilateral -     Tympanometry  Polyp of ear canal, bilateral  Tendonitis, patellar, left  Right shoulder pain  Ganglion cyst of wrist, right  History of ankle fracture

## 2016-05-27 LAB — COMPREHENSIVE METABOLIC PANEL
ALK PHOS: 63 U/L (ref 40–115)
ALT: 24 U/L (ref 9–46)
AST: 23 U/L (ref 10–40)
Albumin: 5.1 g/dL (ref 3.6–5.1)
BILIRUBIN TOTAL: 1.2 mg/dL (ref 0.2–1.2)
BUN: 14 mg/dL (ref 7–25)
CO2: 27 mmol/L (ref 20–31)
CREATININE: 1.03 mg/dL (ref 0.60–1.35)
Calcium: 10.2 mg/dL (ref 8.6–10.3)
Chloride: 100 mmol/L (ref 98–110)
Glucose, Bld: 85 mg/dL (ref 65–99)
POTASSIUM: 4.3 mmol/L (ref 3.5–5.3)
SODIUM: 137 mmol/L (ref 135–146)
Total Protein: 7.6 g/dL (ref 6.1–8.1)

## 2016-05-27 LAB — LIPID PANEL
CHOL/HDL RATIO: 2.7 ratio (ref <=5.0)
Cholesterol: 171 mg/dL (ref 125–200)
HDL: 63 mg/dL (ref >=40)
LDL Cholesterol: 86 mg/dL (ref <130)
Triglycerides: 111 mg/dL (ref <150)
VLDL: 22 mg/dL (ref <30)

## 2016-05-27 LAB — POCT URINALYSIS DIPSTICK
Bilirubin, UA: NEGATIVE
GLUCOSE UA: NEGATIVE
Ketones, UA: NEGATIVE
Leukocytes, UA: NEGATIVE
NITRITE UA: NEGATIVE
Protein, UA: NEGATIVE
RBC UA: NEGATIVE
Spec Grav, UA: 1.01
Urobilinogen, UA: NEGATIVE
pH, UA: 7

## 2016-05-27 LAB — VITAMIN D 25 HYDROXY (VIT D DEFICIENCY, FRACTURES): VIT D 25 HYDROXY: 42 ng/mL (ref 30–100)

## 2017-04-05 ENCOUNTER — Ambulatory Visit (INDEPENDENT_AMBULATORY_CARE_PROVIDER_SITE_OTHER): Payer: 59 | Admitting: Medical

## 2017-04-05 ENCOUNTER — Encounter: Payer: Self-pay | Admitting: Medical

## 2017-04-05 VITALS — Wt 197.2 lb

## 2017-04-05 DIAGNOSIS — G4489 Other headache syndrome: Secondary | ICD-10-CM

## 2017-04-05 DIAGNOSIS — F430 Acute stress reaction: Secondary | ICD-10-CM

## 2017-04-05 DIAGNOSIS — M26623 Arthralgia of bilateral temporomandibular joint: Secondary | ICD-10-CM

## 2017-04-05 MED ORDER — HYDROCODONE-ACETAMINOPHEN 5-325 MG PO TABS: 1.0000 | ORAL_TABLET | Freq: Four times a day (QID) | ORAL | 0 refills | Status: DC | PRN

## 2017-04-05 MED ORDER — CYCLOBENZAPRINE HCL 10 MG PO TABS: 10.0000 mg | ORAL_TABLET | Freq: Every day | ORAL | 0 refills | Status: DC

## 2017-04-05 NOTE — Progress Notes (Signed)
Subjective: Chief Complaint  Patient presents with  . Headache    headache, some dizziness,1 week   Here for a week hx/o constant headache, around front of head, some dizziness.  Can't focus at work due to headaches.  Gets pain in TMJ area as well this past week.   no nausea, no vomiting,no numbness, no tingling, no weakness.  He has no hx/o migraines.  No current allergy problems. No prior recurrent sinusitis problems.   Drinks coffee daily, 2 cups in the morning, and maybe 1 in afternoon.   No other caffeine.  Does have some stress.  Selling and buying a house.  Suppose to close on both houses the same day next week.  Has movers to help the move.   No other aggravating or relieving factors. No other complaint.  Past Medical History:  Diagnosis Date  . GERD (gastroesophageal reflux disease)   . Wears contact lenses    Current Outpatient Prescriptions on File Prior to Visit  Medication Sig Dispense Refill  . Multiple Vitamin (MULTIVITAMIN WITH MINERALS) TABS tablet Take 1 tablet by mouth daily.     No current facility-administered medications on file prior to visit.    Family History  Problem Relation Age of Onset  . Diabetes Maternal Grandfather   . Dementia Maternal Grandfather   . Heart disease Neg Hx   . Cancer Neg Hx   . Stroke Neg Hx   . Hypertension Neg Hx     ROS as in subjective   Objective: Wt 197 lb 3.2 oz (89.4 kg)   SpO2 97%   BMI 30.89 kg/m   reviewed orthostatic vitals  General appearance: alert, no distress, WD/WN,  HEENT: normocephalic, sclerae anicteric, PERRLA, EOMi, nares patent, no discharge or erythema, pharynx normal Oral cavity: MMM, no lesions Neck: supple, no lymphadenopathy, no thyromegaly, no masses Heart: RRR, normal S1, S2, no murmurs Lungs: CTA bilaterally, no wheezes, rhonchi, or rales Extremities: no edema, no cyanosis, no clubbing Pulses: 2+ symmetric, upper and lower extremities, normal cap refill Neurological: alert, oriented x  3, CN2-12 intact, strength normal upper extremities and lower extremities, sensation normal throughout, DTRs 2+ throughout, no cerebellar signs, gait normal Psychiatric: normal affect, behavior normal, pleasant     Assessment: Encounter Diagnoses  Name Primary?  . Headache syndrome Yes  . Bilateral temporomandibular joint pain   . Acute stress reaction      Plan: Headache syndrome today likely combination of stress reaction, TMJ, and he has hx/o grinding his teeth. He uses a mouth guard.   Can use Flexeril tonight, Ibuprofen OTC.  If headache not resolved tomorrow, can try the norco.  If no significant improvements in the next 3 days, then call back. Discussed stress reduction in general.   Micheal Moreno was seen today for headache.  Diagnoses and all orders for this visit:  Headache syndrome  Bilateral temporomandibular joint pain  Acute stress reaction  Other orders -     cyclobenzaprine (FLEXERIL) 10 MG tablet; Take 1 tablet (10 mg total) by mouth at bedtime. -     HYDROcodone-acetaminophen (NORCO) 5-325 MG tablet; Take 1 tablet by mouth every 6 (six) hours as needed for moderate pain.

## 2017-08-04 DIAGNOSIS — M9902 Segmental and somatic dysfunction of thoracic region: Secondary | ICD-10-CM | POA: Diagnosis not present

## 2017-08-04 DIAGNOSIS — M546 Pain in thoracic spine: Secondary | ICD-10-CM | POA: Diagnosis not present

## 2017-08-04 DIAGNOSIS — M9901 Segmental and somatic dysfunction of cervical region: Secondary | ICD-10-CM | POA: Diagnosis not present

## 2017-08-05 DIAGNOSIS — M9901 Segmental and somatic dysfunction of cervical region: Secondary | ICD-10-CM | POA: Diagnosis not present

## 2017-08-05 DIAGNOSIS — M546 Pain in thoracic spine: Secondary | ICD-10-CM | POA: Diagnosis not present

## 2017-08-05 DIAGNOSIS — M9902 Segmental and somatic dysfunction of thoracic region: Secondary | ICD-10-CM | POA: Diagnosis not present

## 2017-08-25 DIAGNOSIS — M542 Cervicalgia: Secondary | ICD-10-CM | POA: Diagnosis not present

## 2017-09-13 ENCOUNTER — Ambulatory Visit: Payer: 59 | Admitting: Family Medicine

## 2017-09-13 ENCOUNTER — Encounter: Payer: Self-pay | Admitting: Family Medicine

## 2017-09-13 VITALS — BP 140/80 | HR 56 | Resp 16 | Wt 204.2 lb

## 2017-09-13 DIAGNOSIS — G44209 Tension-type headache, unspecified, not intractable: Secondary | ICD-10-CM | POA: Diagnosis not present

## 2017-09-13 NOTE — Progress Notes (Signed)
   Subjective:    Patient ID: Micheal Moreno, male    DOB: April 03, 1986, 31 y.o.   MRN: [id number]  HPI He is here for evaluation of a one-week history of intermittent temporal type headache, right greater than left.  He does note that he is under more stress; he has been given a promotion.  He has tried Tylenol without much success.  Has not tried any other medications.  Does have slight pulsing sensation and light does sometimes bother him but he has had no difficulty with nausea, blurred vision, double vision, dizziness.  He was treated for similar episode of this in November when he was under stress with buying a house.  He was given Flexeril and Norco.   Review of Systems     Objective:   Physical Exam Alert and in no distress. Tympanic membranes and canals are normal. Pharyngeal area is normal. Neck is supple without adenopathy or thyromegaly. Cardiac exam shows a regular sinus rhythm without murmurs or gallops. Lungs are clear to auscultation.       Assessment & Plan:  Tension headache  I recommended that he take the Naprosyn on a regular basis for the next several days to try and blunt of the headache and use Norco as needed. I then discussed stress and stress management with him.  I explained that he tends to handle stress with getting headaches.  He did seem to understand that.  Did not feel the need to refer him to counseling at the present time but did discuss stress management with him.

## 2017-09-13 NOTE — Patient Instructions (Signed)
Heat for 20 minutes 3 times per day and gentle stretching after that.  2 Aleve twice per day . Use the serenity prayer

## 2017-09-15 ENCOUNTER — Telehealth: Payer: Self-pay | Admitting: Medical

## 2017-09-15 NOTE — Telephone Encounter (Signed)
Please call patient to set up yearly physical as it has been >1 year and he had recent Emergency Dept visit as well.   This is a friendly reminder to have them come in for annual wellness exam/physical. Thanks Vincenza HewsShane

## 2017-09-16 NOTE — Telephone Encounter (Signed)
Pt coming in Jan 17th for cpe

## 2017-09-23 DIAGNOSIS — Z719 Counseling, unspecified: Secondary | ICD-10-CM | POA: Diagnosis not present

## 2017-10-05 DIAGNOSIS — Z719 Counseling, unspecified: Secondary | ICD-10-CM | POA: Diagnosis not present

## 2017-10-07 ENCOUNTER — Encounter: Payer: 59 | Admitting: Medical

## 2018-03-17 ENCOUNTER — Ambulatory Visit: Payer: 59 | Admitting: Medical

## 2018-03-17 ENCOUNTER — Encounter: Payer: Self-pay | Admitting: Medical

## 2018-03-17 VITALS — BP 120/80 | HR 86 | Temp 98.2°F | Resp 16 | Ht 68.0 in | Wt 204.0 lb

## 2018-03-17 DIAGNOSIS — Z8781 Personal history of (healed) traumatic fracture: Secondary | ICD-10-CM | POA: Diagnosis not present

## 2018-03-17 DIAGNOSIS — M79604 Pain in right leg: Secondary | ICD-10-CM | POA: Diagnosis not present

## 2018-03-17 DIAGNOSIS — S76211A Strain of adductor muscle, fascia and tendon of right thigh, initial encounter: Secondary | ICD-10-CM | POA: Diagnosis not present

## 2018-03-17 DIAGNOSIS — Z0000 Encounter for general adult medical examination without abnormal findings: Secondary | ICD-10-CM

## 2018-03-17 LAB — POCT URINALYSIS DIP (PROADVANTAGE DEVICE)
Bilirubin, UA: NEGATIVE
Blood, UA: NEGATIVE
Glucose, UA: NEGATIVE mg/dL
Ketones, POC UA: NEGATIVE mg/dL
Leukocytes, UA: NEGATIVE
NITRITE UA: NEGATIVE
PH UA: 6 (ref 5.0–8.0)
PROTEIN UA: NEGATIVE mg/dL
Specific Gravity, Urine: 1.01
Urobilinogen, Ur: 3.5

## 2018-03-17 NOTE — Progress Notes (Signed)
Subjective:   HPI  Micheal Moreno is a 32 y.o. male who presents for a complete physical. Been in usual state of health.    Concerns: She notes having a lab panel done at work in January that was comprehensive.  He will get me a copy of this.  He has a history of right ankle fracture and surgery, lately been having some pains intermittently of the right lower leg.  He thinks the metal hardware from his surgery is causing the pain.  No swelling.  No recent fall or trauma.  Also pulled a groin muscle a couple weeks ago on the right, it is getting better but not fully healed  He has a new baby on the way.   [place]  Reviewed their medical, surgical, family, social, medication, and allergy history and updated chart as appropriate.   Past Medical History:  Diagnosis Date  . GERD (gastroesophageal reflux disease)   . Wears contact lenses     Past Surgical History:  Procedure Laterality Date  . ANKLE SURGERY  2015   fracture, ORIF, s/p soccer injury  . WISDOM TOOTH EXTRACTION  2009    Family History  Problem Relation Age of Onset  . Cataracts Mother   . Diabetes Maternal Grandfather   . Dementia Maternal Grandfather   . Heart disease Neg Hx   . Cancer Neg Hx   . Stroke Neg Hx   . Hypertension Neg Hx     Social History   Socioeconomic History  . Marital status: Single    Spouse name: Not on file  . Number of children: Not on file  . Years of education: Not on file  . Highest education level: Not on file  Occupational History  . Not on file  Social Needs  . Financial resource strain: Not on file  . Food insecurity:    Worry: Not on file    Inability: Not on file  . Transportation needs:    Medical: Not on file    Non-medical: Not on file  Tobacco Use  . Smoking status: Never Smoker  . Smokeless tobacco: Never Used  Substance and Sexual Activity  . Alcohol use: No  . Drug use: No  . Sexual activity: Not on file  Lifestyle  .  Physical activity:    Days per week: Not on file    Minutes per session: Not on file  . Stress: Not on file  Relationships  . Social connections:    Talks on phone: Not on file    Gets together: Not on file    Attends religious service: Not on file    Active member of club or organization: Not on file    Attends meetings of clubs or organizations: Not on file    Relationship status: Not on file  . Intimate partner violence:    Fear of current or ex partner: Not on file    Emotionally abused: Not on file    Physically abused: Not on file    Forced sexual activity: Not on file  Other Topics Concern  . 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  1 dog.  Has 1 toddler and 1 on the way.   02/2018    Current Outpatient Medications on File Prior to Visit  Medication Sig Dispense Refill  . Multiple Vitamin (MULTIVITAMIN WITH MINERALS) TABS tablet  Take 1 tablet by mouth daily.     No current facility-administered medications on file prior to visit.     No Known Allergies   Review of Systems Constitutional: -fever, -chills, -sweats, -unexpected weight change, -decreased appetite, -fatigue Allergy: -sneezing, -itching, -congestion Dermatology: -changing moles, --rash, -lumps ENT: -runny nose, -ear pain, -sore throat, -hoarseness, -sinus pain, -teeth pain, - ringing in ears, -hearing loss, -nosebleeds Cardiology: -chest pain, -palpitations, -swelling, -difficulty breathing when lying flat, -waking up short of breath Respiratory: -cough, -shortness of breath, -difficulty breathing with exercise or exertion, -wheezing, -coughing up blood Gastroenterology: -abdominal pain, -nausea, -vomiting, -diarrhea, -constipation, -blood in stool, -changes in bowel movement, -difficulty swallowing or eating Hematology: -bleeding, -bruising  Musculoskeletal: +joint aches, -muscle aches, -joint  swelling, -back pain, -neck pain, -cramping, -changes in gait Ophthalmology: denies vision changes, eye redness, itching, discharge Urology: -burning with urination, -difficulty urinating, -blood in urine, -urinary frequency, -urgency, -incontinence Neurology: -headache, -weakness, -tingling, -numbness, -memory loss, -falls, -dizziness Psychology: -depressed mood, -agitation, -sleep problems     Objective:   Physical Exam BP 120/80   Pulse 86   Temp 98.2 F (36.8 C) (Oral)   Resp 16   Ht 5\' 8"  (1.727 m)   Wt 204 lb (92.5 kg)   SpO2 97%   BMI 31.02 kg/m   General appearance: alert, no distress, WD/WN, white male Skin: few scattered benign appearing papular lesions, 1 each left and right abdomen 3mm diameter, pink, 2 flat 3mm diameter macules right mid back, otherwise no worrisome lesions HEENT: normocephalic, conjunctiva/corneas normal, sclerae anicteric, PERRLA, EOMi, nares patent, symmetrical 2mm raised pink polyp appearing lesions of bilat ear canals, anterior portion of canal, no discharge or erythema, pharynx normal Oral cavity: MMM, tongue normal, teeth in good repair Neck: supple, no lymphadenopathy, no thyromegaly, no masses, normal ROM Chest: non tender, normal shape and expansion Heart: RRR, normal S1, S2, no murmurs Lungs: CTA bilaterally, no wheezes, rhonchi, or rales Abdomen: +bs, soft, non tender, non distended, no masses, no hepatomegaly, no splenomegaly, no bruits Back: non tender, normal ROM, no scoliosis Musculoskeletal: Right upper thigh nontender, no swelling no deformity, right distal lateral leg just proximal to the ankle with mild tenderness but no deformity, normal foot and ankle range of motion, lateral vertical surgical scar over right ankle, otherwise upper extremities non tender, no obvious deformity, normal ROM throughout, lower extremities non tender, no obvious deformity, normal ROM throughout Extremities: no edema, no cyanosis, no clubbing Pulses: 2+  symmetric, upper and lower extremities, normal cap refill Neurological: alert, oriented x 3, CN2-12 intact, strength normal upper extremities and lower extremities, sensation normal throughout, DTRs 2+ throughout, no cerebellar signs, gait normal Psychiatric: normal affect, behavior normal, pleasant  GU: normal male external genitalia, circumcised, nontender, no masses, no hernia, no lymphadenopathy Rectal: deferred   Assessment and Plan :    Encounter Diagnoses  Name Primary?  . Routine general medical examination at a health care facility Yes  . Right leg pain   . History of ankle fracture   . Groin strain, right, initial encounter     Physical exam - discussed healthy lifestyle, diet, exercise, preventative care, vaccinations, and addressed their concerns.   Routine labs today See your eye doctor yearly for routine vision care. See your dentist yearly for routine dental care including hygiene visits twice yearly. He will get me a copy of the labs from work Right leg pain, groin strain-discussed home rehab stretching and exercises and symptoms should gradually resolve. Right ankle pain-discussed  his symptoms Advised to follow-up with orthopedics, have an updated x-ray  Follow-up pending labs   Micheal Moreno was seen today for annual exam.  Diagnoses and all orders for this visit:  Routine general medical examination at a health care facility -     POCT Urinalysis DIP (Proadvantage Device)  Right leg pain  History of ankle fracture  Groin strain, right, initial encounter  pending lab review

## 2018-03-18 ENCOUNTER — Telehealth: Payer: Self-pay | Admitting: Medical

## 2018-03-18 NOTE — Telephone Encounter (Signed)
Pt received labs thru his job. Received those fax. Sending back for review.

## 2018-03-21 NOTE — Telephone Encounter (Signed)
Pt was notified of results

## 2018-03-21 NOTE — Telephone Encounter (Signed)
I reviewed the labs that came in.    No worrisome findings.   Thanks for helping us get the labs for review.

## 2018-03-28 ENCOUNTER — Encounter: Payer: Self-pay | Admitting: Medical

## 2018-04-20 DIAGNOSIS — M25571 Pain in right ankle and joints of right foot: Secondary | ICD-10-CM | POA: Diagnosis not present

## 2018-05-19 DIAGNOSIS — T8484XA Pain due to internal orthopedic prosthetic devices, implants and grafts, initial encounter: Secondary | ICD-10-CM | POA: Diagnosis not present

## 2018-05-19 DIAGNOSIS — T84498A Other mechanical complication of other internal orthopedic devices, implants and grafts, initial encounter: Secondary | ICD-10-CM | POA: Diagnosis not present

## 2018-05-30 DIAGNOSIS — T84498D Other mechanical complication of other internal orthopedic devices, implants and grafts, subsequent encounter: Secondary | ICD-10-CM | POA: Diagnosis not present

## 2018-12-05 ENCOUNTER — Other Ambulatory Visit: Payer: Self-pay

## 2018-12-05 ENCOUNTER — Ambulatory Visit: Payer: 59 | Admitting: Medical

## 2018-12-05 ENCOUNTER — Encounter: Payer: Self-pay | Admitting: Medical

## 2018-12-05 VITALS — BP 130/82 | HR 81 | Temp 98.1°F | Resp 16 | Ht 68.0 in | Wt 196.0 lb

## 2018-12-05 DIAGNOSIS — R05 Cough: Secondary | ICD-10-CM | POA: Diagnosis not present

## 2018-12-05 DIAGNOSIS — R059 Cough, unspecified: Secondary | ICD-10-CM

## 2018-12-05 DIAGNOSIS — R0602 Shortness of breath: Secondary | ICD-10-CM | POA: Diagnosis not present

## 2018-12-05 NOTE — Progress Notes (Signed)
  Subjective:     Patient ID: Micheal Moreno, male   DOB: Feb 18, 1986, 33 y.o.   MRN: [id number]  HPI Chief Complaint  Patient presents with  . cough    cough, SOB X Friday night  no travel, no fever   Here for cough, SOB x 3 days.   Wanted to get checked out since he has a new baby and toddler at home.   think it could be allergies but not sure.  Was outside all day [place], around pollen, so wondered about this causing the symptoms.  He reports cough, SOB.  No fever, no chills, no body aches, no sore throat, no ear pain.   Had slight ear pain, but no NVD.   No sick contacts other than guy at work that had a cold.   No recent exposure to known Coronavirus.   No recent foreign travel, no recent travel in airports.  In the past can get some cough and irritation with allergies.  No hx/o asthma, nonsmoker.  No other aggravating or relieving factors. No other complaint.   Past Medical History:  Diagnosis Date  . GERD (gastroesophageal reflux disease)   . Wears contact lenses    Current Outpatient Medications on File Prior to Visit  Medication Sig Dispense Refill  . Multiple Vitamin (MULTIVITAMIN WITH MINERALS) TABS tablet Take 1 tablet by mouth daily.     No current facility-administered medications on file prior to visit.     Review of Systems As in subjective    Objective:   Physical Exam BP 130/82   Pulse 81   Temp 98.1 F (36.7 C) (Oral)   Resp 16   Ht 5\' 8"  (1.727 m)   Wt 196 lb (88.9 kg)   SpO2 97%   BMI 29.80 kg/m   General appearance: alert, no distress, WD/WN,  HEENT: normocephalic, sclerae anicteric, TMs pearly, nares patent, no discharge or erythema, pharynx normal Oral cavity: MMM, no lesions Neck: supple, no lymphadenopathy, no thyromegaly, no masses Heart: RRR, normal S1, S2, no murmurs Lungs: CTA bilaterally, no wheezes, rhonchi, or rales Abdomen: +bs, soft, non tender, non distended, no masses, no hepatomegaly, no splenomegaly Pulses: 2+  symmetric, upper and lower extremities, normal cap refill        Assessment:     Encounter Diagnoses  Name Primary?  . Cough Yes  . SOB (shortness of breath)        Plan:     symptom and exam suggest allergies as likely trigger for cough and mild SOB vs early viral URI.   No recent injury, no recent surgery or concern for DVT/PE, well appearing, low suspicion for Coronaviurs exposure.  Advised antihistamine or OTC cough suppressant, good hydration, and discussed preventative measures in the event this is early URI.      Advised to call or return if new or worsening symptoms of concern.  Reassured.

## 2019-03-14 ENCOUNTER — Encounter: Payer: Self-pay | Admitting: Medical

## 2019-03-14 ENCOUNTER — Ambulatory Visit (INDEPENDENT_AMBULATORY_CARE_PROVIDER_SITE_OTHER): Payer: 59 | Admitting: Medical

## 2019-03-14 ENCOUNTER — Other Ambulatory Visit: Payer: Self-pay

## 2019-03-14 VITALS — Temp 98.3°F | Ht 69.0 in | Wt 190.0 lb

## 2019-03-14 DIAGNOSIS — F419 Anxiety disorder, unspecified: Secondary | ICD-10-CM | POA: Diagnosis not present

## 2019-03-14 DIAGNOSIS — R0602 Shortness of breath: Secondary | ICD-10-CM

## 2019-03-14 DIAGNOSIS — R0789 Other chest pain: Secondary | ICD-10-CM | POA: Diagnosis not present

## 2019-03-14 NOTE — Progress Notes (Signed)
Subjective:     Patient ID: Micheal Moreno, male   DOB: 05/04/1986, 33 y.o.   MRN: [id number]  This visit type was conducted due to national recommendations for restrictions regarding the COVID-19 Pandemic (e.g. social distancing) in an effort to limit this patient's exposure and mitigate transmission in our community.  This format is felt to be most appropriate for this patient at this time.    Documentation for virtual audio and video telecommunications through Zoom encounter:  The patient was located at home. The provider was located in the office. The patient did consent to this visit and is aware of possible charges through their insurance for this visit.  The other persons participating in this telemedicine service were none. Time spent on call was 20 minutes and in review of previous records >20 minutes total.  This virtual service is not related to other E/M service within previous 7 days.   HPI Chief Complaint  Patient presents with  . headache    SOB and headache, tighness chest  X 2 days   Here for not feeling well. He reports chest tightness, shortness of breath, headache x 2 days.  Has to take a deep breath to get a good breath at times.   Not doing anything active when this occurs.   Wife is not sick, no one in household is sick.  No recent sick contacts.  No covid contacts.   He is working full-time and everyone at work is wearing mask.  He has no reason to suspect any recent COVID exposure.  He has been careful in terms of contact with the general public.  His only other contact is been close family contacts.  He saw me a few weeks ago for similar symptoms and his symptoms resolved.  He denies cough, no sore throat, sense of taste and smell still there.  No fever, no diarrhea.  No head congestion.  No body aches or chills.  No nausea, no vomiting.  No covid contact.  No recent travel.     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  He does feel some  underlying anxiety.  Given the state of the world, the coronavirus pandemic, race relations, the fact that he has a young child recently born in the house, has overall anxiety about things.  Denies panic attacks.  No prior diagnosis of anxiety.  No family history of mental health issues or heart disease.  Overall is healthy.  Does not smoke.  No heavy alcohol use.  He does exercise regularly.  He does get some allergy issues, some sneezing and congestion but mild.  Not taking allergy medicine in general.  Past Medical History:  Diagnosis Date  . GERD (gastroesophageal reflux disease)   . Wears contact lenses     Family History  Problem Relation Age of Onset  . Cataracts Mother   . Diabetes Maternal Grandfather   . Dementia Maternal Grandfather   . Heart disease Neg Hx   . Cancer Neg Hx   . Stroke Neg Hx   . Hypertension Neg Hx      Review of Systems As in subjective    Objective:   Physical Exam  Temp 98.3 F (36.8 C) (Oral)   Ht 5\' 9"  (1.753 m)   Wt 190 lb (86.2 kg)   BMI 28.06 kg/m   Due to coronavirus pandemic stay at home measures, patient visit was virtual and they were not examined in person.   Gen: wd,  wn, nad      Assessment:     Encounter Diagnoses  Name Primary?  . Chest tightness Yes  . Anxiety   . SOB (shortness of breath)        Plan:     We discussed his symptoms and concerns.  We discussed the limitations of virtual visit that I cannot examine him in person over the phone.  Based on the fact that he has a healthy diet and no significant family history and based on his prior exams I doubt this is a significant heart or lung issue.  I cannot completely rule this out though.  I did offer the option to come in for EKG.  He wants to hold off on this at this time.  If his symptoms continue he will set up a time to come in and do this.  He will consider beginning some allergy medicine since he does have some mild allergy issues.  Overall I believe his  issue is underlying anxiety.  We talked about some ways to cope with anxiety including exercise, journaling, deep breathing techniques, meditation, focusing on his faith, and not spend a lot of time on social media and watching the news and a time where there is a lot of division and politics are nasty.  Consider talking with his pastor or consider counseling.  F/u in a few weeks, sooner prn.  Micheal Moreno was seen today for headache.  Diagnoses and all orders for this visit:  Chest tightness  Anxiety  SOB (shortness of breath)

## 2021-08-12 ENCOUNTER — Encounter: Payer: Self-pay | Admitting: *Deleted

## 2021-08-15 ENCOUNTER — Emergency Department: Payer: BC Managed Care – PPO

## 2021-08-15 ENCOUNTER — Other Ambulatory Visit: Payer: Self-pay

## 2021-08-15 ENCOUNTER — Emergency Department
Admission: EM | Admit: 2021-08-15 | Discharge: 2021-08-15 | Disposition: A | Discharge status: Home / Self Care | Payer: BC Managed Care – PPO | Attending: Emergency Medicine | Admitting: Emergency Medicine

## 2021-08-15 DIAGNOSIS — R197 Diarrhea, unspecified: Secondary | ICD-10-CM | POA: Diagnosis not present

## 2021-08-15 DIAGNOSIS — R109 Unspecified abdominal pain: Secondary | ICD-10-CM | POA: Insufficient documentation

## 2021-08-15 DIAGNOSIS — R110 Nausea: Secondary | ICD-10-CM | POA: Diagnosis not present

## 2021-08-15 DIAGNOSIS — R300 Dysuria: Secondary | ICD-10-CM | POA: Diagnosis not present

## 2021-08-15 LAB — URINALYSIS, ROUTINE W REFLEX MICROSCOPIC
Bacteria, UA: NONE SEEN
Bilirubin Urine: NEGATIVE
Glucose, UA: NEGATIVE mg/dL
Hgb urine dipstick: NEGATIVE
Ketones, ur: NEGATIVE mg/dL
Leukocytes,Ua: NEGATIVE
Nitrite: NEGATIVE
Protein, ur: NEGATIVE mg/dL
Specific Gravity, Urine: <1.005 — ABNORMAL LOW (ref 1.005–1.030)
Squamous Epithelial / LPF: NONE SEEN (ref 0–5)
WBC, UA: NONE SEEN WBC/hpf (ref 0–5)
pH: 5.5 (ref 5.0–8.0)

## 2021-08-15 LAB — CBC WITH DIFFERENTIAL/PLATELET
Abs Immature Granulocytes: 0.05 10*3/uL (ref 0.00–0.07)
Basophils Absolute: 0.1 10*3/uL (ref 0.0–0.1)
Basophils Relative: 1 %
Eosinophils Absolute: 0.1 10*3/uL (ref 0.0–0.5)
Eosinophils Relative: 2 %
HCT: 46.4 % (ref 39.0–52.0)
Hemoglobin: 16 g/dL (ref 13.0–17.0)
Immature Granulocytes: 1 %
Lymphocytes Relative: 25 %
Lymphs Abs: 1.9 10*3/uL (ref 0.7–4.0)
MCH: 29.2 pg (ref 26.0–34.0)
MCHC: 34.5 g/dL (ref 30.0–36.0)
MCV: 84.7 fL (ref 80.0–100.0)
Monocytes Absolute: 0.5 10*3/uL (ref 0.1–1.0)
Monocytes Relative: 7 %
Neutro Abs: 4.9 10*3/uL (ref 1.7–7.7)
Neutrophils Relative %: 64 %
Platelets: 263 10*3/uL (ref 150–400)
RBC: 5.48 MIL/uL (ref 4.22–5.81)
RDW: 11.5 % (ref 11.5–15.5)
WBC: 7.5 10*3/uL (ref 4.0–10.5)
nRBC: 0 % (ref 0.0–0.2)

## 2021-08-15 LAB — COMPREHENSIVE METABOLIC PANEL
ALT: 32 U/L (ref 0–44)
AST: 26 U/L (ref 15–41)
Albumin: 4.6 g/dL (ref 3.5–5.0)
Alkaline Phosphatase: 73 U/L (ref 38–126)
Anion gap: 8 (ref 5–15)
BUN: 16 mg/dL (ref 6–20)
CO2: 25 mmol/L (ref 22–32)
Calcium: 9.5 mg/dL (ref 8.9–10.3)
Chloride: 103 mmol/L (ref 98–111)
Creatinine, Ser: 0.99 mg/dL (ref 0.61–1.24)
GFR, Estimated: >60 mL/min (ref >60)
Glucose, Bld: 100 mg/dL — ABNORMAL HIGH (ref 70–99)
Potassium: 4.1 mmol/L (ref 3.5–5.1)
Sodium: 136 mmol/L (ref 135–145)
Total Bilirubin: 1.3 mg/dL — ABNORMAL HIGH (ref 0.3–1.2)
Total Protein: 7.9 g/dL (ref 6.5–8.1)

## 2021-08-15 NOTE — ED Provider Notes (Signed)
[facility] Emergency Department Provider Note   ____________________________________________    I have reviewed the triage vital signs and the nursing notes.   HISTORY  Chief Complaint Abdominal Pain and Flank Pain     HPI Micheal Moreno is a 35 y.o. male who presents back discomfort, some dysuria over the last 3 to 4 weeks.  He reports over the last 3 to 4 weeks this has been occurring, when he urinates he has a slightly uncomfortable sensation, denies burning.  No penile discharge.  No fevers or chills.  Has had some nausea and diarrhea intermittently.  No history of kidney stones, no hematuria.  Past Medical History:  Diagnosis Date   GERD (gastroesophageal reflux disease)    Wears contact lenses     Patient Active Problem List   Diagnosis Date Noted   Need for prophylactic vaccination and inoculation against influenza 05/26/2016   Annual physical exam 05/26/2016   History of ankle fracture 05/26/2016    Past Surgical History:  Procedure Laterality Date   ANKLE SURGERY  2015   fracture, ORIF, s/p soccer injury   WISDOM TOOTH EXTRACTION  2009    Prior to Admission medications   Medication Sig Start Date End Date Taking? Authorizing Provider  Multiple Vitamin (MULTIVITAMIN WITH MINERALS) TABS tablet Take 1 tablet by mouth daily.    [provider]     Allergies Patient has no known allergies.  Family History  Problem Relation Age of Onset   Cataracts Mother    Diabetes Maternal Grandfather    Dementia Maternal Grandfather    Heart disease Neg Hx    Cancer Neg Hx    Stroke Neg Hx    Hypertension Neg Hx     Social History Social History   Tobacco Use   Smoking status: Never   Smokeless tobacco: Never  Substance Use Topics   Alcohol use: No   Drug use: No    Review of Systems  Constitutional: No fever/chills Eyes: No visual changes.  ENT: No sore throat. Cardiovascular: Denies chest pain. Respiratory:  Denies shortness of breath. Gastrointestinal: No abdominal pain.  Nausea today, now better Genitourinary: As above Musculoskeletal: Negative for back pain. Skin: Negative for rash. Neurological: Negative for headaches or weakness   ____________________________________________   PHYSICAL EXAM:  VITAL SIGNS: ED Triage Vitals  Enc Vitals Group     BP 08/15/21 1144 97/63     Pulse Rate 08/15/21 1144 93     Resp 08/15/21 1144 16     Temp 08/15/21 1144 (!) 97.4 F (36.3 C)     Temp Source 08/15/21 1144 Oral     SpO2 08/15/21 1144 98 %     Weight 08/15/21 1145 93 kg (205 lb)     Height 08/15/21 1145 1.753 m (5\' 9" )     Head Circumference --      Peak Flow --      Pain Score 08/15/21 1145 6     Pain Loc --      Pain Edu? --      Excl. in GC? --     Constitutional: Alert and oriented. No acute distress. Pleasant and interactive  Nose: No congestion/rhinnorhea. Mouth/Throat: Mucous membranes are moist.   Neck:  Painless ROM Cardiovascular: Normal rate, regular rhythm. Grossly normal heart sounds.  Good peripheral circulation. Respiratory: Normal respiratory effort.  No retractions. Lungs CTAB. Gastrointestinal: Soft and nontender. No distention.  No CVA tenderness.  Reassuring exam Genitourinary: deferred Musculoskeletal:Warm  and well perfused Neurologic:  Normal speech and language. No gross focal neurologic deficits are appreciated.  Skin:  Skin is warm, dry and intact. No rash noted. Psychiatric: Mood and affect are normal. Speech and behavior are normal.  ____________________________________________   LABS (all labs ordered are listed, but only abnormal results are displayed)  Labs Reviewed  COMPREHENSIVE METABOLIC PANEL - Abnormal; Notable for the following components:      Result Value   Glucose, Bld 100 (*)    Total Bilirubin 1.3 (*)    All other components within normal limits  URINALYSIS, ROUTINE W REFLEX MICROSCOPIC - Abnormal; Notable for the following  components:   APPearance CLEAR (*)    Specific Gravity, Urine <1.005 (*)    All other components within normal limits  URINE CULTURE  CBC WITH DIFFERENTIAL/PLATELET   ____________________________________________  EKG  None ____________________________________________  RADIOLOGY  CT renal stone study reviewed by me, no acute abnormality ____________________________________________   PROCEDURES  Procedure(s) performed: No  Procedures   Critical Care performed: No ____________________________________________   INITIAL IMPRESSION / ASSESSMENT AND PLAN / ED COURSE  Pertinent labs & imaging results that were available during my care of the patient were reviewed by me and considered in my medical decision making (see chart for details).   Patient presents with mild intermittent low back pain, some discomfort with urination over the last 3 to 4 weeks, afebrile here well-appearing, urinalysis is reassuring, no hematuria or evidence of UTI, will add on urine culture.  Lab work is unremarkable, CT renal stone study demonstrates possible thickening of the bladder wall however this is difficult to gauge.  Given his symptoms however I have strongly recommended that he follow-up with urology, he notes that he actually already has a referral to urology for further evaluation and he will follow-up with them next week.    ____________________________________________   FINAL CLINICAL IMPRESSION(S) / ED DIAGNOSES  Final diagnoses:  Dysuria        Note:  This document was prepared using Dragon voice recognition software and may include unintentional dictation errors.    Lavonia Drafts, MD 08/15/21 [phone]

## 2021-08-15 NOTE — ED Triage Notes (Signed)
Pt reports that he has been having left lower abd pain and left flank pain, pt states that he has been having some diarrhea and reports dry heaving this am, states has been bothering him intermittently for a week, denies hx of kidney stones or diverticulitis

## 2021-08-15 NOTE — ED Provider Notes (Signed)
Emergency Medicine Provider Triage Evaluation Note  Jeremian B. Ledwell , a 35 y.o. male  was evaluated in triage.  Pt complains of left lower quadrant pain, intermittently for 3 weeks.  Some diarrhea, some dry heaving.  Review of Systems  Positive: Left lower quadrant pain Negative: Fever, chills, chest pain or shortness of breath, history of kidney stones  Physical Exam  BP 97/63 (BP Location: Left Arm)   Pulse 93   Temp (!) 97.4 F (36.3 C) (Oral)   Resp 16   Ht 5\' 9"  (1.753 m)   Wt 93 kg   SpO2 98%   BMI 30.27 kg/m  Gen:   Awake, no distress   Resp:  Normal effort  MSK:   Moves extremities without difficulty  Other:  Abdomen is nontender  Medical Decision Making  Medically screening exam initiated at 11:47 AM.  Appropriate orders placed.  Gedalia B. Levinson was informed that the remainder of the evaluation will be completed by another provider, this initial triage assessment does not replace that evaluation, and the importance of remaining in the ED until their evaluation is complete.     Marchelle Folks, PA-C 08/15/21 1148    08/17/21, MD 08/15/21 1450

## 2021-08-17 LAB — URINE CULTURE: Culture: NO GROWTH

## 2021-08-22 ENCOUNTER — Ambulatory Visit: Payer: BC Managed Care – PPO | Admitting: Urology

## 2021-08-22 ENCOUNTER — Encounter: Payer: Self-pay | Admitting: Urology

## 2021-08-22 ENCOUNTER — Other Ambulatory Visit: Payer: Self-pay

## 2021-08-22 VITALS — BP 133/77 | HR 82 | Ht 69.0 in | Wt 208.5 lb

## 2021-08-22 DIAGNOSIS — R102 Pelvic and perineal pain: Secondary | ICD-10-CM | POA: Diagnosis not present

## 2021-08-22 DIAGNOSIS — R399 Unspecified symptoms and signs involving the genitourinary system: Secondary | ICD-10-CM

## 2021-08-22 DIAGNOSIS — M545 Low back pain, unspecified: Secondary | ICD-10-CM | POA: Diagnosis not present

## 2021-08-22 DIAGNOSIS — R300 Dysuria: Secondary | ICD-10-CM

## 2021-08-22 LAB — URINALYSIS, COMPLETE
Bilirubin, UA: NEGATIVE
Glucose, UA: NEGATIVE
Ketones, UA: NEGATIVE
Leukocytes,UA: NEGATIVE
Nitrite, UA: NEGATIVE
Protein,UA: NEGATIVE
RBC, UA: NEGATIVE
Specific Gravity, UA: <1.005 — ABNORMAL LOW (ref 1.005–1.030)
Urobilinogen, Ur: 0.2 mg/dL (ref 0.2–1.0)
pH, UA: 6.5 (ref 5.0–7.5)

## 2021-08-22 LAB — MICROSCOPIC EXAMINATION
Bacteria, UA: NONE SEEN
Epithelial Cells (non renal): NONE SEEN /hpf (ref 0–10)
RBC: NONE SEEN /hpf (ref 0–2)
WBC, UA: NONE SEEN /hpf (ref 0–5)

## 2021-08-22 MED ORDER — TAMSULOSIN HCL 0.4 MG PO CAPS: 0.4000 mg | ORAL_CAPSULE | Freq: Every day | ORAL | 0 refills | Status: DC

## 2021-08-22 NOTE — Progress Notes (Signed)
08/22/2021 1:52 PM   Amaury B. Stengel 06-Jul-1986 [id number]  Referring provider: Carlena Hurl, PA-C [address] [place],  [place] [address]  Chief Complaint  Patient presents with   Dysuria    HPI: Micheal Moreno is a 35 y.o. male referred for evaluation of low back pain and urinary symptoms  4 week history of bilateral low back pain Moderate in severity Also with suprapubic discomfort with associated urinary hesitancy and urgency No dysuria or gross hematuria + Nocturia x1-2 Saw PCP and urine culture was negative ED visit 08/15/2021; urinalysis was unremarkable Noncontrast CT abdomen pelvis without significant abnormalities Mild DDD L5-S1 noted Taking meloxicam for the discomfort   PMH: Past Medical History:  Diagnosis Date   GERD (gastroesophageal reflux disease)    Wears contact lenses     Surgical History: Past Surgical History:  Procedure Laterality Date   ANKLE SURGERY  2015   fracture, ORIF, s/p soccer injury   WISDOM TOOTH EXTRACTION  2009    Home Medications:  Allergies as of 08/22/2021   No Known Allergies      Medication List        Accurate as of August 22, 2021  1:52 PM. If you have any questions, ask your nurse or doctor.          cyclobenzaprine 5 MG tablet Commonly known as: FLEXERIL Take 5 mg by mouth at bedtime.   meloxicam 15 MG tablet Commonly known as: MOBIC Take 15 mg by mouth daily.   multivitamin with minerals Tabs tablet Take 1 tablet by mouth daily.        Allergies: No Known Allergies  Family History: Family History  Problem Relation Age of Onset   Cataracts Mother    Diabetes Maternal Grandfather    Dementia Maternal Grandfather    Heart disease Neg Hx    Cancer Neg Hx    Stroke Neg Hx    Hypertension Neg Hx     Social History:  reports that he has never smoked. He has never used smokeless tobacco. He reports that he does not drink alcohol and does not use drugs.   Physical Exam: BP 133/77  (BP Location: Left Arm, Patient Position: Sitting, Cuff Size: Normal)   Pulse 82   Ht 5\' 9"  (1.753 m)   Wt 208 lb 8 oz (94.6 kg)   BMI 30.79 kg/m   Constitutional:  Alert and oriented, No acute distress. HEENT: [place] AT, moist mucus membranes.  Trachea midline, no masses. Cardiovascular: No clubbing, cyanosis, or edema. Respiratory: Normal respiratory effort, no increased work of breathing. GI: Abdomen is soft, nontender, nondistended, no abdominal masses GU: No CVA tenderness Skin: No rashes, bruises or suspicious lesions. Neurologic: Grossly intact, no focal deficits, moving all 4 extremities. Psychiatric: Normal mood and affect.  Laboratory Data:  Urinalysis Dipstick/microscopy negative   Pertinent Imaging: CT images were personally reviewed and interpreted  CT Renal Stone Study  Narrative CLINICAL DATA:  Flank pain.  Evaluate for nephrolithiasis.  EXAM: CT ABDOMEN AND PELVIS WITHOUT CONTRAST  TECHNIQUE: Multidetector CT imaging of the abdomen and pelvis was performed following the standard protocol without IV contrast.  COMPARISON:  None.  FINDINGS: The lack of intravenous contrast limits the ability to evaluate solid abdominal organs.  Lower chest: Limited visualization of the lower thorax is degraded secondary to patient respiratory artifact. No focal airspace opacities. No pleural effusion.  Normal heart size.  No pericardial effusion.  Hepatobiliary: Normal hepatic contour. Normal noncontrast appearance of the gallbladder.  No radiopaque gallstones. No ascites.  Pancreas: Normal noncontrast appearance of the pancreas.  Spleen: Normal noncontrast appearance of the spleen. Note is made of a small splenule about the splenic hilum.  Adrenals/Urinary Tract: Normal noncontrast appearance of the bilateral kidneys. No radiopaque renal stones. No renal stones are seen along the expected course of either ureter or the urinary bladder. Note is made of a small  right-sided extrarenal pelvis. No evidence of urinary obstruction or perinephric stranding.  There is mild diffuse thickening of the urinary bladder wall, potentially accentuated due to underdistention though conceivably could be seen in the setting of cystitis.  Normal noncontrast appearance of the bilateral adrenal glands.  Stomach/Bowel: Large colonic stool burden without evidence of enteric obstruction. Normal noncontrast appearance of the terminal ileum and the appendix. No significant hiatal hernia. No pneumoperitoneum, pneumatosis or portal venous gas.  Vascular/Lymphatic: Normal caliber of the abdominal aorta. No bulky retroperitoneal, mesenteric, pelvic or inguinal lymphadenopathy on this noncontrast examination.  Reproductive: Normal noncontrast appearance the prostate gland. No free fluid within the pelvic cul-de-sac.  Other: Tiny mesenteric fat containing periumbilical hernia. There is a minimal amount of subcutaneous edema about the midline of the low back.  Musculoskeletal: No acute or aggressive osseous abnormalities. Mild DDD of L5-S1 with disc space height loss, endplate irregularity and sclerosis. Mild degenerative changes bilateral hips with joint space loss, subchondral sclerosis and osteophytosis.  IMPRESSION: 1. Mild circumferential wall thickening involving the urinary bladder wall, potentially accentuated by underdistention though could be seen in the setting of a cystitis. Clinical correlation is advised. 2. Otherwise, no explanation for patient's flank pain. Specifically, no evidence of nephrolithiasis or urinary obstruction. 3. Large colonic stool burden without evidence of enteric obstruction. 4. Normal noncontrast appearance of the appendix and gallbladder.   Electronically Signed By: Simonne Come M.D. On: 08/15/2021 12:43   Assessment & Plan:   35 y.o. male with 1 month history of bilateral low back pain, suprapubic pain and lower urinary  tract symptoms Urinalysis and imaging negative We discussed possible etiologies including inflammatory prostatitis and pelvic pain syndrome Have initially recommended trial of tamsulosin 0.4 mg daily Continue NSAID 1 month follow-up for symptom reassessment and if still symptomatic perform DRE at that visit to assess pelvic floor musculature   Riki Altes, MD  [facility] [address] [place], [place] [address] [phone]

## 2021-09-13 ENCOUNTER — Other Ambulatory Visit: Payer: Self-pay | Admitting: Urology

## 2021-09-24 ENCOUNTER — Ambulatory Visit: Payer: BC Managed Care – PPO | Admitting: Urology

## 2021-09-24 ENCOUNTER — Encounter: Payer: Self-pay | Admitting: Urology

## 2021-09-24 ENCOUNTER — Other Ambulatory Visit: Payer: Self-pay

## 2021-09-24 VITALS — BP 104/63 | HR 80 | Ht 69.0 in | Wt 200.0 lb

## 2021-09-24 DIAGNOSIS — R102 Pelvic and perineal pain: Secondary | ICD-10-CM | POA: Diagnosis not present

## 2021-09-24 DIAGNOSIS — R399 Unspecified symptoms and signs involving the genitourinary system: Secondary | ICD-10-CM | POA: Diagnosis not present

## 2021-09-24 LAB — BLADDER SCAN AMB NON-IMAGING: Scan Result: 7

## 2021-09-24 NOTE — Progress Notes (Signed)
° °  09/24/2021 1:59 PM   Micheal Moreno Jan 08, 1986 [id number]  Referring provider: Jac Canavan, PA-C [address] [place],  [place] [address]  Chief Complaint  Patient presents with   Follow-up    HPI: 37 y.o. male presents for 1 month follow-up of low back pain, subpubic pain and LUTS.  Refer to prior note 08/22/2021 He has noted some improvement in his symptoms on tamsulosin.  He was off the medication for 2 days while traveling and did note some recurrent symptoms Most bothersome symptom is intermittent spasmodic pain in the pelvis   PMH: Past Medical History:  Diagnosis Date   GERD (gastroesophageal reflux disease)    Wears contact lenses     Surgical History: Past Surgical History:  Procedure Laterality Date   ANKLE SURGERY  2015   fracture, ORIF, s/p soccer injury   WISDOM TOOTH EXTRACTION  2009    Home Medications:  Allergies as of 09/24/2021   No Known Allergies      Medication List        Accurate as of September 24, 2021  1:59 PM. If you have any questions, ask your nurse or doctor.          STOP taking these medications    cyclobenzaprine 5 MG tablet Commonly known as: FLEXERIL Stopped by: Riki Altes, MD   meloxicam 15 MG tablet Commonly known as: MOBIC Stopped by: Riki Altes, MD       TAKE these medications    multivitamin with minerals Tabs tablet Take 1 tablet by mouth daily.   tamsulosin 0.4 MG Caps capsule Commonly known as: FLOMAX TAKE 1 CAPSULE BY MOUTH EVERY DAY        Allergies: No Known Allergies  Family History: Family History  Problem Relation Age of Onset   Cataracts Mother    Diabetes Maternal Grandfather    Dementia Maternal Grandfather    Heart disease Neg Hx    Cancer Neg Hx    Stroke Neg Hx    Hypertension Neg Hx     Social History:  reports that he has never smoked. He has never used smokeless tobacco. He reports that he does not drink alcohol and does not use drugs.   Physical  Exam: BP 104/63    Pulse 80    Ht 5\' 9"  (1.753 m)    Wt 200 lb (90.7 kg)    BMI 29.53 kg/m   Constitutional:  Alert and oriented, No acute distress. HEENT: [place] AT, moist mucus membranes.  Trachea midline, no masses. Cardiovascular: No clubbing, cyanosis, or edema. Respiratory: Normal respiratory effort, no increased work of breathing.   Assessment & Plan:    1. Lower urinary tract symptoms (LUTS) with chronic pelvic pain Some improvement with tamsulosin He will take for another month and then will have him take as needed We discussed PT referral for persistent symptoms We also discussed possible triggering factors including dietary and stress   , MD  [facility] [address] [place], [place] [phone]

## 2021-10-14 ENCOUNTER — Other Ambulatory Visit: Payer: Self-pay | Admitting: Urology

## 2022-01-16 ENCOUNTER — Other Ambulatory Visit: Payer: Self-pay | Admitting: Urology

## 2022-05-27 ENCOUNTER — Encounter: Payer: Self-pay | Admitting: Internal Medicine

## 2022-06-30 ENCOUNTER — Encounter: Payer: Self-pay | Admitting: Internal Medicine

## 2024-02-06 ENCOUNTER — Ambulatory Visit: Admission: EM | Admit: 2024-02-06 | Discharge: 2024-02-06 | Disposition: A | Discharge status: Home / Self Care

## 2024-02-06 ENCOUNTER — Ambulatory Visit (INDEPENDENT_AMBULATORY_CARE_PROVIDER_SITE_OTHER)

## 2024-02-06 DIAGNOSIS — R0602 Shortness of breath: Secondary | ICD-10-CM | POA: Diagnosis not present

## 2024-02-06 DIAGNOSIS — R079 Chest pain, unspecified: Secondary | ICD-10-CM

## 2024-02-06 MED ORDER — ALBUTEROL SULFATE HFA 108 (90 BASE) MCG/ACT IN AERS: 1.0000 | INHALATION_SPRAY | Freq: Four times a day (QID) | RESPIRATORY_TRACT | 0 refills | Status: AC | PRN

## 2024-02-06 NOTE — ED Provider Notes (Addendum)
 Arlander Bellman    CSN: [id number] Arrival date & time: 02/06/24  1114      History   Chief Complaint Chief Complaint  Patient presents with   Shortness of Breath    HPI Micheal Moreno is a 38 y.o. male.  Patient presents with 10-day history of shortness of breath, occasional wheezing, occasional nonproductive cough.  He reports intermittent "pressure" in his chest; none currently.  No fever, ear pain, sore throat, congestion.  He was seen for his symptoms by the medical provider at his work on 01/26/2024 and prescribed a prednisone taper which he completed.  He reports some relief of his symptoms.  Patient reports no history of cardiac or pulmonary disease.  He was able to run 5 miles this morning without difficulty.  The history is provided by the patient and medical records.    Past Medical History:  Diagnosis Date   GERD (gastroesophageal reflux disease)    Wears contact lenses     Patient Active Problem List   Diagnosis Date Noted   Need for prophylactic vaccination and inoculation against influenza 05/26/2016   Annual physical exam 05/26/2016   History of ankle fracture 05/26/2016    Past Surgical History:  Procedure Laterality Date   ANKLE SURGERY  2015   fracture, ORIF, s/p soccer injury   WISDOM TOOTH EXTRACTION  2009       Home Medications    Prior to Admission medications   Medication Sig Start Date End Date Taking? Authorizing Provider  albuterol (VENTOLIN HFA) 108 (90 Base) MCG/ACT inhaler Inhale 1-2 puffs into the lungs every 6 (six) hours as needed. 02/06/24  Yes Wellington Half, NP  predniSONE (DELTASONE) 10 MG tablet Take by mouth. 01/27/24  Yes [provider]  Multiple Vitamin (MULTIVITAMIN WITH MINERALS) TABS tablet Take 1 tablet by mouth daily.    [provider]  tamsulosin  (FLOMAX ) 0.4 MG CAPS capsule TAKE 1 CAPSULE BY MOUTH EVERY DAY Patient not taking: Reported on 02/06/2024 01/19/22   Geraline Knapp, MD    Family  History Family History  Problem Relation Age of Onset   Cataracts Mother    Diabetes Maternal Grandfather    Dementia Maternal Grandfather    Heart disease Neg Hx    Cancer Neg Hx    Stroke Neg Hx    Hypertension Neg Hx     Social History Social History   Tobacco Use   Smoking status: Never   Smokeless tobacco: Never  Substance Use Topics   Alcohol use: No   Drug use: No     Allergies   Patient has no known allergies.   Review of Systems Review of Systems  Constitutional:  Negative for chills and fever.  HENT:  Negative for congestion, ear pain and sore throat.   Respiratory:  Positive for cough and shortness of breath.   Cardiovascular:  Positive for chest pain. Negative for palpitations.  Neurological:  Negative for dizziness, weakness and numbness.     Physical Exam Triage Vital Signs ED Triage Vitals  Encounter Vitals Group     BP 02/06/24 1217 123/65     Systolic BP Percentile --      Diastolic BP Percentile --      Pulse Rate 02/06/24 1217 79     Resp 02/06/24 1217 18     Temp 02/06/24 1217 98.4 F (36.9 C)     Temp src --      SpO2 02/06/24 1217 98 %  Weight --      Height --      Head Circumference --      Peak Flow --      Pain Score 02/06/24 1216 2     Pain Loc --      Pain Education --      Exclude from Growth Chart --    No data found.  Updated Vital Signs BP 123/65   Pulse 79   Temp 98.4 F (36.9 C)   Resp 18   SpO2 98%   Visual Acuity Right Eye Distance:   Left Eye Distance:   Bilateral Distance:    Right Eye Near:   Left Eye Near:    Bilateral Near:     Physical Exam Constitutional:      General: He is not in acute distress. HENT:     Right Ear: Tympanic membrane normal.     Left Ear: Tympanic membrane normal.     Nose: Nose normal.     Mouth/Throat:     Mouth: Mucous membranes are moist.     Pharynx: Oropharynx is clear.  Cardiovascular:     Rate and Rhythm: Normal rate and regular rhythm.     Heart sounds:  Normal heart sounds.  Pulmonary:     Effort: Pulmonary effort is normal. No respiratory distress.     Breath sounds: Normal breath sounds.  Neurological:     General: No focal deficit present.     Mental Status: He is alert.     Sensory: No sensory deficit.     Motor: No weakness.     Gait: Gait normal.      UC Treatments / Results  Labs (all labs ordered are listed, but only abnormal results are displayed) Labs Reviewed - No data to display  EKG   Radiology DG Chest 2 View Result Date: 02/06/2024 CLINICAL DATA:  Shortness of breath EXAM: CHEST - 2 VIEW COMPARISON:  None Available. FINDINGS: Lateral view degraded by patient arm position. Midline trachea.  Normal heart size and mediastinal contours. Sharp costophrenic angles.  No pneumothorax.  Clear lungs. IMPRESSION: No active cardiopulmonary disease. Electronically Signed   By: Lore Rode M.D.   On: 02/06/2024 12:46    Procedures Procedures (including critical care time)  Medications Ordered in UC Medications - No data to display  Initial Impression / Assessment and Plan / UC Course  I have reviewed the triage vital signs and the nursing notes.  Pertinent labs & imaging results that were available during my care of the patient were reviewed by me and considered in my medical decision making (see chart for details).   Shortness of breath, Chest pain.  Afebrile and vital signs are stable.  Lungs are clear and O2 sat is 98% on room air.  Patient declines EKG.  He declines transfer to the ED.  CXR negative.  Patient just completed a prednisone taper which was prescribed by the medical provider at his work.  He was able to run 5 miles this morning without difficulty.  Treating with albuterol inhaler and instructed patient to follow-up with his PCP tomorrow.  ED precautions given.  Education provided on shortness of breath and chest pain.  He agrees to plan of care.  Final Clinical Impressions(s) / UC Diagnoses   Final  diagnoses:  Shortness of breath  Chest pain, unspecified type     Discharge Instructions      Follow up with your primary care provider tomorrow.  Go to the  emergency department if you have worsening symptoms.    Use the albuterol inhaler as directed.    Your chest x-ray is normal.   ED Prescriptions     Medication Sig Dispense Auth. Provider   albuterol (VENTOLIN HFA) 108 (90 Base) MCG/ACT inhaler Inhale 1-2 puffs into the lungs every 6 (six) hours as needed. 18 g Wellington Half, NP      PDMP not reviewed this encounter.   Wellington Half, NP 02/06/24 1250    Wellington Half, NP 02/06/24 [phone]

## 2024-02-06 NOTE — Discharge Instructions (Addendum)
 Follow up with your primary care provider tomorrow.  Go to the emergency department if you have worsening symptoms.    Use the albuterol inhaler as directed.    Your chest x-ray is normal.

## 2024-02-06 NOTE — ED Triage Notes (Addendum)
 Patient to Urgent Care with complaints of shortness of breath/ difficulty taking a good deep breath/ dry cough/ wheezing.   Symptoms x10 days. Completed prednisone taper prescribed 5/8. 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
